# Patient Record
Sex: Female | Born: 1985 | Race: White | Hispanic: No | Marital: Married | Smoking: Never smoker
Health system: Southern US, Community
[De-identification: ages and names within clinical notes are randomized; demographics above are authoritative.]

## PROBLEM LIST (undated history)

## (undated) ENCOUNTER — Emergency Department (HOSPITAL_COMMUNITY): Payer: No Typology Code available for payment source

## (undated) DIAGNOSIS — Z8619 Personal history of other infectious and parasitic diseases: Secondary | ICD-10-CM

## (undated) DIAGNOSIS — D649 Anemia, unspecified: Secondary | ICD-10-CM

## (undated) HISTORY — DX: Personal history of other infectious and parasitic diseases: Z86.19

## (undated) HISTORY — PX: MANDIBLE SURGERY: SHX707

---

## 2014-08-17 ENCOUNTER — Ambulatory Visit (INDEPENDENT_AMBULATORY_CARE_PROVIDER_SITE_OTHER): Payer: PRIVATE HEALTH INSURANCE | Admitting: Sports Medicine

## 2014-08-17 ENCOUNTER — Ambulatory Visit (INDEPENDENT_AMBULATORY_CARE_PROVIDER_SITE_OTHER): Payer: PRIVATE HEALTH INSURANCE

## 2014-08-17 ENCOUNTER — Encounter: Payer: Self-pay | Admitting: Sports Medicine

## 2014-08-17 VITALS — BP 112/70 | HR 56 | Ht 64.0 in | Wt 113.0 lb

## 2014-08-17 DIAGNOSIS — M25551 Pain in right hip: Secondary | ICD-10-CM

## 2014-08-17 DIAGNOSIS — Z0000 Encounter for general adult medical examination without abnormal findings: Secondary | ICD-10-CM | POA: Insufficient documentation

## 2014-08-17 DIAGNOSIS — Z8262 Family history of osteoporosis: Secondary | ICD-10-CM | POA: Insufficient documentation

## 2014-08-17 DIAGNOSIS — B354 Tinea corporis: Secondary | ICD-10-CM

## 2014-08-17 DIAGNOSIS — M25559 Pain in unspecified hip: Secondary | ICD-10-CM

## 2014-08-17 MED ORDER — DICLOFENAC SODIUM 75 MG PO TBEC: 75.0000 mg | DELAYED_RELEASE_TABLET | Freq: Two times a day (BID) | ORAL | Status: DC

## 2014-08-17 MED ORDER — CLOTRIMAZOLE-BETAMETHASONE 1-0.05 % EX CREA: 1.0000 "application " | TOPICAL_CREAM | Freq: Two times a day (BID) | CUTANEOUS | Status: DC

## 2014-08-17 NOTE — Assessment & Plan Note (Signed)
Topical Lotrisone. 

## 2014-08-17 NOTE — Patient Instructions (Signed)

## 2014-08-17 NOTE — Assessment & Plan Note (Signed)
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 We will start conservatively with hip flexor rehabilitation and diclofenac. X-rays.

## 2014-08-17 NOTE — Assessment & Plan Note (Signed)
Up-to-date on cervical cancer screening earlier this year. Checking routine blood work.

## 2014-08-17 NOTE — Assessment & Plan Note (Signed)
Per patient request we are going to obtain a DEXA scan.

## 2014-08-17 NOTE — Progress Notes (Signed)
  Subjective:    CC: Establish care.   HPI:  This is a very pleasant 28 year old female comes to establish care.  Skin rash: Present on the right abdomen, circular, pruritic, present for several weeks. She does have a dog. No other lesions.  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 No mechanical symptoms, mild, persistent.  History of osteoporosis: Desires bone density testing.  Preventative measures: Up-to-date on cervical cancer screening, needs a flu shot, up to date on TD.  Past medical history, Surgical history, Family history not pertinant except as noted below, Social history, Allergies, and medications have been entered into the medical record, reviewed, and no changes needed.   Review of Systems: No headache, visual changes, nausea, vomiting, diarrhea, constipation, dizziness, abdominal pain, skin rash, fevers, chills, night sweats, swollen lymph nodes, weight loss, chest pain, body aches, joint swelling, muscle aches, shortness of breath, mood changes, visual or auditory hallucinations.  Objective:    General: Well Developed, well nourished, and in no acute distress.  Neuro: Alert and oriented x3, extra-ocular muscles intact, sensation grossly intact.  HEENT: Normocephalic, atraumatic, pupils equal round reactive to light, neck supple, no masses, no lymphadenopathy, thyroid nonpalpable.  Skin: Warm and dry, there is a circular, scaly, erythematous rash on the right lower abdomen approximately 3 cm across. Cardiac: Regular rate and rhythm, no murmurs rubs or gallops.  Respiratory: Clear to auscultation bilaterally. Not using accessory muscles, speaking in full sentences.  Abdominal: Soft, nontender, nondistended, positive bowel sounds, no masses, no organomegaly.  Bilateral Hip: ROM IR: 60 Deg, ER: 60 Deg, Flexion: 120 Deg, Extension: 100 Deg, Abduction: 45 Deg, Adduction: 45 Deg Strength IR: 5/5, ER: 5/5, Flexion:  5/5, Extension: 5/5, Abduction: 5/5, Adduction: 5/5 Pelvic alignment unremarkable to inspection and palpation. Standing hip rotation and gait without trendelenburg / unsteadiness. Greater trochanter without tenderness to palpation. No tenderness over piriformis. No SI joint tenderness and normal minimal SI movement. Positive FADIR test worse on the right.  Impression and Recommendations:    The patient was counselled, risk factors were discussed, anticipatory guidance given.

## 2014-09-14 ENCOUNTER — Ambulatory Visit: Payer: PRIVATE HEALTH INSURANCE | Admitting: Sports Medicine

## 2014-09-27 ENCOUNTER — Encounter: Payer: Self-pay | Admitting: Sports Medicine

## 2014-09-27 ENCOUNTER — Ambulatory Visit (INDEPENDENT_AMBULATORY_CARE_PROVIDER_SITE_OTHER): Payer: PRIVATE HEALTH INSURANCE | Admitting: Sports Medicine

## 2014-09-27 VITALS — BP 130/82 | HR 64 | Ht 59.0 in | Wt 111.0 lb

## 2014-09-27 DIAGNOSIS — B354 Tinea corporis: Secondary | ICD-10-CM

## 2014-09-27 DIAGNOSIS — Z23 Encounter for immunization: Secondary | ICD-10-CM

## 2014-09-27 DIAGNOSIS — Z0000 Encounter for general adult medical examination without abnormal findings: Secondary | ICD-10-CM

## 2014-09-27 MED ORDER — ITRACONAZOLE 200 MG PO TABS: 1.0000 | ORAL_TABLET | Freq: Every day | ORAL | Status: DC

## 2014-09-27 NOTE — Assessment & Plan Note (Signed)
Up-to-date. Tdap given today.

## 2014-09-27 NOTE — Progress Notes (Signed)
  Subjective:    CC: Follow-up   HPI: Skin rash: I diagnosed her with tinea corporis at the last visit, we started with Lotrisone, unfortunately the rash worsened. It is only mildly pruritic.  Preventative measures: Up-to-date on flu, needs TD, Pap smear was in April of this year.  Hip pain: Resolved  Past medical history, Surgical history, Family history not pertinant except as noted below, Social history, Allergies, and medications have been entered into the medical record, reviewed, and no changes needed.   Review of Systems: No fevers, chills, night sweats, weight loss, chest pain, or shortness of breath.   Objective:    General: Well Developed, well nourished, and in no acute distress.  Neuro: Alert and oriented x3, extra-ocular muscles intact, sensation grossly intact.  HEENT: Normocephalic, atraumatic, pupils equal round reactive to light, neck supple, no masses, no lymphadenopathy, thyroid nonpalpable.  Skin: Warm and dry, there is a 6 cm circular rash with scaling border on the left anterior abdominal wall. Cardiac: Regular rate and rhythm, no murmurs rubs or gallops, no lower extremity edema.  Respiratory: Clear to auscultation bilaterally. Not using accessory muscles, speaking in full sentences.  Impression and Recommendations:

## 2014-09-27 NOTE — Assessment & Plan Note (Signed)
Persistence despite topical Lotrisone. Switching to oral itraconazole 200 mg daily for a week. Return in 2 weeks, if rash is still present we will biopsy.

## 2014-10-12 ENCOUNTER — Ambulatory Visit: Payer: PRIVATE HEALTH INSURANCE | Admitting: Sports Medicine

## 2014-10-13 ENCOUNTER — Ambulatory Visit (INDEPENDENT_AMBULATORY_CARE_PROVIDER_SITE_OTHER): Payer: PRIVATE HEALTH INSURANCE | Admitting: Sports Medicine

## 2014-10-13 ENCOUNTER — Encounter: Payer: Self-pay | Admitting: Sports Medicine

## 2014-10-13 VITALS — BP 111/70 | HR 60 | Ht 64.0 in | Wt 112.0 lb

## 2014-10-13 DIAGNOSIS — B354 Tinea corporis: Secondary | ICD-10-CM

## 2014-10-13 NOTE — Progress Notes (Signed)
  Subjective:    CC: follow-up  HPI: Tinea corporis: Large right-sided anterior abdominal lesion.  Initially [place] failed topical Lotrisone cream, in fact her lesion worsen, subsequently I switched to itraconazole for 7 days, she returns today with rash resolved.  Past medical history, Surgical history, Family history not pertinant except as noted below, Social history, Allergies, and medications have been entered into the medical record, reviewed, and no changes needed.   Review of Systems: No fevers, chills, night sweats, weight loss, chest pain, or shortness of breath.   Objective:    General: Well Developed, well nourished, and in no acute distress.  Neuro: Alert and oriented x3, extra-ocular muscles intact, sensation grossly intact.  HEENT: Normocephalic, atraumatic, pupils equal round reactive to light, neck supple, no masses, no lymphadenopathy, thyroid nonpalpable.  Skin: Warm and dry, no rashes.there is a slight area of postinflammatory hypopigmentation from the rash however it is no longer scaling, smaller, and essentially resolved. No pruritus or excoriations. Cardiac: Regular rate and rhythm, no murmurs rubs or gallops, no lower extremity edema.  Respiratory: Clear to auscultation bilaterally. Not using accessory muscles, speaking in full sentences.  Impression and Recommendations:

## 2014-10-13 NOTE — Assessment & Plan Note (Signed)
Insufficient response to topical Lotrisone. Rash has essentially resolved with oral itraconazole. Return as needed.

## 2014-11-30 ENCOUNTER — Encounter: Payer: Self-pay | Admitting: Physician Assistant

## 2014-11-30 ENCOUNTER — Ambulatory Visit (INDEPENDENT_AMBULATORY_CARE_PROVIDER_SITE_OTHER): Payer: PRIVATE HEALTH INSURANCE | Admitting: Physician Assistant

## 2014-11-30 VITALS — BP 129/84 | HR 86 | Ht 64.0 in | Wt 111.0 lb

## 2014-11-30 DIAGNOSIS — Z0000 Encounter for general adult medical examination without abnormal findings: Secondary | ICD-10-CM

## 2014-11-30 DIAGNOSIS — R110 Nausea: Secondary | ICD-10-CM

## 2014-11-30 DIAGNOSIS — N898 Other specified noninflammatory disorders of vagina: Secondary | ICD-10-CM

## 2014-11-30 MED ORDER — OMEPRAZOLE 40 MG PO CPDR: 40.0000 mg | DELAYED_RELEASE_CAPSULE | Freq: Every day | ORAL | Status: DC

## 2014-11-30 NOTE — Progress Notes (Signed)
Subjective:    Patient ID: Dana Eaton, female    DOB: 03/15/1986, 28 y.o.   MRN: [id number]  HPI Yeast on pap- treated over the counter. Feel like kept it since April. Often thick discharge.  Stomach pain towards end of work. Nausea off and on. 1 month. Sexual active no protection.    Review of Systems     Objective:   Physical Exam        Assessment & Plan:   Subjective:     Dana Eaton is a 28 y.o. female and is here for a comprehensive physical exam. The patient reports problems - she continues to have thick white vaginal discharge since april. does not itch. no odor. no pain. denies any pelivic pain. last pap normal and  april 2015. she is also having on and off nausea iwthout vomiting. nothing makes better or worse. seems to be while she is at work towards the end of the day. no fever, or chills. .  History   Social History  . Marital Status: Married    Spouse Name: N/A    Number of Children: N/A  . Years of Education: N/A   Occupational History  . Not on file.   Social History Main Topics  . Smoking status: Never Smoker   . Smokeless tobacco: Not on file  . Alcohol Use: Not on file  . Drug Use: Not on file  . Sexual Activity: Not on file   Other Topics Concern  . Not on file   Social History Narrative   Health Maintenance  Topic Date Due  . INFLUENZA VACCINE  07/04/2015  . PAP SMEAR  03/03/2017  . TETANUS/TDAP  09/27/2024    The following portions of the patient's history were reviewed and updated as appropriate: allergies, current medications, past family history, past medical history, past social history, past surgical history and problem list.  Review of Systems A comprehensive review of systems was negative.   Objective:    BP 129/84 mmHg  Pulse 86  Ht 5\' 4"  (1.626 m)  Wt 111 lb (50.349 kg)  BMI 19.04 kg/m2 General appearance: alert, cooperative and appears stated age Head: Normocephalic, without obvious abnormality,  atraumatic Eyes: conjunctivae/corneas clear. PERRL, EOM's intact. Fundi benign. Ears: normal TM's and external ear canals both ears Nose: Nares normal. Septum midline. Mucosa normal. No drainage or sinus tenderness. Throat: lips, mucosa, and tongue normal; teeth and gums normal Neck: no adenopathy, no carotid bruit, no JVD, supple, symmetrical, trachea midline and thyroid not enlarged, symmetric, no tenderness/mass/nodules Back: symmetric, no curvature. ROM normal. No CVA tenderness. Lungs: clear to auscultation bilaterally Heart: regular rate and rhythm, S1, S2 normal, no murmur, click, rub or gallop Abdomen: soft, non-tender; bowel sounds normal; no masses,  no organomegaly Pelvic: cervix normal in appearance, external genitalia normal, no adnexal masses or tenderness, no cervical motion tenderness and vaginal area with white thick discharge around cervix.  Extremities: extremities normal, atraumatic, no cyanosis or edema Pulses: 2+ and symmetric Skin: Skin color, texture, turgor normal. No rashes or lesions Lymph nodes: Cervical, supraclavicular, and axillary nodes normal. Neurologic: Grossly normal    Assessment:    Healthy female exam.      Plan:    CPE- pap up to date and did not repeat today. Vaccines up to date. Due to vaginal discharge obtained a wet prep. Discussed exercise 150minutes weekly and MVI.   Vaginal discharge- wet prep sent off. Pt concerned with yeast overgrowth in her whole body. Gave  stool culture cup to look for yeast in intestine. Reassured would treat if wet prep positive. Not as likely yeast overgrowth in intestines but will evaluate. Follow up with PCP.   Nausea without vomiting- UPT negative. Could represent signs of acid reflux. Given omeprazole to try. If worsens or continues follow up with PCP.  See After Visit Summary for Counseling Recommendations

## 2014-11-30 NOTE — Patient Instructions (Signed)

## 2014-12-01 ENCOUNTER — Other Ambulatory Visit: Payer: Self-pay | Admitting: Sports Medicine

## 2014-12-01 LAB — COMPREHENSIVE METABOLIC PANEL
Alkaline Phosphatase: 34 U/L — ABNORMAL LOW (ref 39–117)
BUN: 16 mg/dL (ref 6–23)
CO2: 25 mEq/L (ref 19–32)
Chloride: 102 mEq/L (ref 96–112)
Creat: 0.77 mg/dL (ref 0.50–1.10)
Total Bilirubin: 0.8 mg/dL (ref 0.2–1.2)
Total Protein: 6.9 g/dL (ref 6.0–8.3)

## 2014-12-01 LAB — CBC
HCT: 39.1 % (ref 36.0–46.0)
Hemoglobin: 13.3 g/dL (ref 12.0–15.0)
MCH: 31.6 pg (ref 26.0–34.0)
MCHC: 34 g/dL (ref 30.0–36.0)
MCV: 92.9 fL (ref 78.0–100.0)
Platelets: 233 10*3/uL (ref 150–400)
RBC: 4.21 MIL/uL (ref 3.87–5.11)
RDW: 12.7 % (ref 11.5–15.5)
WBC: 7.2 K/uL (ref 4.0–10.5)

## 2014-12-01 LAB — WET PREP FOR TRICH, YEAST, CLUE
Clue Cells Wet Prep HPF POC: NONE SEEN
Trich, Wet Prep: NONE SEEN
WBC, Wet Prep HPF POC: NONE SEEN
YEAST WET PREP: NONE SEEN

## 2014-12-01 LAB — HEMOGLOBIN A1C
Hgb A1c MFr Bld: 5 % (ref <5.7)
Mean Plasma Glucose: 97 mg/dL (ref <117)

## 2014-12-01 LAB — COMPREHENSIVE METABOLIC PANEL WITH GFR
ALT: 14 U/L (ref 0–35)
AST: 16 U/L (ref 0–37)
Albumin: 4.5 g/dL (ref 3.5–5.2)
Calcium: 9.3 mg/dL (ref 8.4–10.5)
Glucose, Bld: 91 mg/dL (ref 70–99)
Potassium: 3.9 meq/L (ref 3.5–5.3)
Sodium: 137 meq/L (ref 135–145)

## 2014-12-01 LAB — LIPID PANEL
Cholesterol: 139 mg/dL (ref 0–200)
HDL: 61 mg/dL (ref >39)
LDL Cholesterol: 69 mg/dL (ref 0–99)
Total CHOL/HDL Ratio: 2.3 ratio
Triglycerides: 46 mg/dL (ref <150)
VLDL: 9 mg/dL (ref 0–40)

## 2014-12-01 LAB — POCT URINE PREGNANCY: Preg Test, Ur: NEGATIVE

## 2014-12-02 LAB — VITAMIN D 25 HYDROXY (VIT D DEFICIENCY, FRACTURES): Vit D, 25-Hydroxy: 42 ng/mL (ref 30–100)

## 2014-12-02 LAB — TSH: TSH: 1.641 u[IU]/mL (ref 0.350–4.500)

## 2014-12-05 LAB — STOOL CULTURE

## 2014-12-06 LAB — HSV(HERPES SMPLX)ABS-I+II(IGG+IGM)-BLD
HSV 1 Glycoprotein G Ab, IgG: 11.2 IV — ABNORMAL HIGH
HSV 2 Glycoprotein G Ab, IgG: <0.1 IV
Herpes Simplex Vrs I&II-IgM Ab (EIA): 2.24 INDEX — ABNORMAL HIGH

## 2014-12-13 ENCOUNTER — Other Ambulatory Visit: Payer: Self-pay | Admitting: Sports Medicine

## 2014-12-13 DIAGNOSIS — N898 Other specified noninflammatory disorders of vagina: Secondary | ICD-10-CM

## 2014-12-14 LAB — GC/CHLAMYDIA PROBE AMP, URINE
CHLAMYDIA, SWAB/URINE, PCR: NEGATIVE
GC PROBE AMP, URINE: NEGATIVE

## 2015-12-04 NOTE — L&D Delivery Note (Signed)
Delivery Note Pt progressed to complete at 0049 and began pushing well. At 1:46 AM, with pt in hands and knees position, a viable female was delivered via Vaginal, Spontaneous Delivery (Presentation: ROA w/ compound hand ).  APGAR: 9, 9; weight: pending  .   Placenta status: spont , intact .  Cord:  3 vessels  Anesthesia:  None Episiotomy: None Lacerations: None (sm abrasion L labia- not repaired) Est. Blood Loss (mL): 50  Mom to postpartum.  Baby to Couplet care / Skin to Skin.  Cam HaiSHAW, Amory Zbikowski CNM 10/04/2016, 2:05 AM

## 2016-02-16 ENCOUNTER — Encounter: Payer: PRIVATE HEALTH INSURANCE | Admitting: Obstetrics & Gynecology

## 2016-02-20 ENCOUNTER — Encounter: Payer: Self-pay | Admitting: Advanced Practice Midwife

## 2016-02-20 ENCOUNTER — Ambulatory Visit (INDEPENDENT_AMBULATORY_CARE_PROVIDER_SITE_OTHER): Payer: No Typology Code available for payment source | Admitting: Advanced Practice Midwife

## 2016-02-20 ENCOUNTER — Other Ambulatory Visit: Payer: Self-pay | Admitting: Advanced Practice Midwife

## 2016-02-20 VITALS — BP 108/66 | HR 63 | Wt 111.0 lb

## 2016-02-20 DIAGNOSIS — Z3401 Encounter for supervision of normal first pregnancy, first trimester: Secondary | ICD-10-CM

## 2016-02-20 DIAGNOSIS — Z124 Encounter for screening for malignant neoplasm of cervix: Secondary | ICD-10-CM | POA: Diagnosis not present

## 2016-02-20 DIAGNOSIS — Z3491 Encounter for supervision of normal pregnancy, unspecified, first trimester: Secondary | ICD-10-CM | POA: Diagnosis not present

## 2016-02-20 DIAGNOSIS — Z113 Encounter for screening for infections with a predominantly sexual mode of transmission: Secondary | ICD-10-CM | POA: Diagnosis not present

## 2016-02-20 DIAGNOSIS — Z36 Encounter for antenatal screening of mother: Secondary | ICD-10-CM

## 2016-02-20 LAB — POCT BEDSIDE ULTRASOUND
CRL: 1.6 cm
Heart Rate Cardiac Output M-Mode: 158 {beats}/min

## 2016-02-20 NOTE — Patient Instructions (Signed)
First Trimester of Pregnancy The first trimester of pregnancy is from week 1 until the end of week 12 (months 1 through 3). A week after a sperm fertilizes an egg, the egg will implant on the wall of the uterus. This embryo will begin to develop into a baby. Genes from you and your partner are forming the baby. The female genes determine whether the baby is a boy or a girl. At 6-8 weeks, the eyes and face are formed, and the heartbeat can be seen on ultrasound. At the end of 12 weeks, all the baby's organs are formed.  Now that you are pregnant, you will want to do everything you can to have a healthy baby. Two of the most important things are to get good prenatal care and to follow your health care provider's instructions. Prenatal care is all the medical care you receive before the baby's birth. This care will help prevent, find, and treat any problems during the pregnancy and childbirth. BODY CHANGES Your body goes through many changes during pregnancy. The changes vary from woman to woman.   You may gain or lose a couple of pounds at first.  You may feel sick to your stomach (nauseous) and throw up (vomit). If the vomiting is uncontrollable, call your health care provider.  You may tire easily.  You may develop headaches that can be relieved by medicines approved by your health care provider.  You may urinate more often. Painful urination may mean you have a bladder infection.  You may develop heartburn as a result of your pregnancy.  You may develop constipation because certain hormones are causing the muscles that push waste through your intestines to slow down.  You may develop hemorrhoids or swollen, bulging veins (varicose veins).  Your breasts may begin to grow larger and become tender. Your nipples may stick out more, and the tissue that surrounds them (areola) may become darker.  Your gums may bleed and may be sensitive to brushing and flossing.  Dark spots or blotches (chloasma,  mask of pregnancy) may develop on your face. This will likely fade after the baby is born.  Your menstrual periods will stop.  You may have a loss of appetite.  You may develop cravings for certain kinds of food.  You may have changes in your emotions from day to day, such as being excited to be pregnant or being concerned that something may go wrong with the pregnancy and baby.  You may have more vivid and strange dreams.  You may have changes in your hair. These can include thickening of your hair, rapid growth, and changes in texture. Some women also have hair loss during or after pregnancy, or hair that feels dry or thin. Your hair will most likely return to normal after your baby is born. WHAT TO EXPECT AT YOUR PRENATAL VISITS During a routine prenatal visit:  You will be weighed to make sure you and the baby are growing normally.  Your blood pressure will be taken.  Your abdomen will be measured to track your baby's growth.  The fetal heartbeat will be listened to starting around week 10 or 12 of your pregnancy.  Test results from any previous visits will be discussed. Your health care provider may ask you:  How you are feeling.  If you are feeling the baby move.  If you have had any abnormal symptoms, such as leaking fluid, bleeding, severe headaches, or abdominal cramping.  If you are using any tobacco products,   including cigarettes, chewing tobacco, and electronic cigarettes.  If you have any questions. Other tests that may be performed during your first trimester include:  Blood tests to find your blood type and to check for the presence of any previous infections. They will also be used to check for low iron levels (anemia) and Rh antibodies. Later in the pregnancy, blood tests for diabetes will be done along with other tests if problems develop.  Urine tests to check for infections, diabetes, or protein in the urine.  An ultrasound to confirm the proper growth  and development of the baby.  An amniocentesis to check for possible genetic problems.  Fetal screens for spina bifida and Down syndrome.  You may need other tests to make sure you and the baby are doing well.  HIV (human immunodeficiency virus) testing. Routine prenatal testing includes screening for HIV, unless you choose not to have this test. HOME CARE INSTRUCTIONS  Medicines  Follow your health care provider's instructions regarding medicine use. Specific medicines may be either safe or unsafe to take during pregnancy.  Take your prenatal vitamins as directed.  If you develop constipation, try taking a stool softener if your health care provider approves. Diet  Eat regular, well-balanced meals. Choose a variety of foods, such as meat or vegetable-based protein, fish, milk and low-fat dairy products, vegetables, fruits, and whole grain breads and cereals. Your health care provider will help you determine the amount of weight gain that is right for you.  Avoid raw meat and uncooked cheese. These carry germs that can cause birth defects in the baby.  Eating four or five small meals rather than three large meals a day may help relieve nausea and vomiting. If you start to feel nauseous, eating a few soda crackers can be helpful. Drinking liquids between meals instead of during meals also seems to help nausea and vomiting.  If you develop constipation, eat more high-fiber foods, such as fresh vegetables or fruit and whole grains. Drink enough fluids to keep your urine clear or pale yellow. Activity and Exercise  Exercise only as directed by your health care provider. Exercising will help you:  Control your weight.  Stay in shape.  Be prepared for labor and delivery.  Experiencing pain or cramping in the lower abdomen or low back is a good sign that you should stop exercising. Check with your health care provider before continuing normal exercises.  Try to avoid standing for long  periods of time. Move your legs often if you must stand in one place for a long time.  Avoid heavy lifting.  Wear low-heeled shoes, and practice good posture.  You may continue to have sex unless your health care provider directs you otherwise. Relief of Pain or Discomfort  Wear a good support bra for breast tenderness.   Take warm sitz baths to soothe any pain or discomfort caused by hemorrhoids. Use hemorrhoid cream if your health care provider approves.   Rest with your legs elevated if you have leg cramps or low back pain.  If you develop varicose veins in your legs, wear support hose. Elevate your feet for 15 minutes, 3-4 times a day. Limit salt in your diet. Prenatal Care  Schedule your prenatal visits by the twelfth week of pregnancy. They are usually scheduled monthly at first, then more often in the last 2 months before delivery.  Write down your questions. Take them to your prenatal visits.  Keep all your prenatal visits as directed by your   health care provider. Safety  Wear your seat belt at all times when driving.  Make a list of emergency phone numbers, including numbers for family, friends, the hospital, and police and fire departments. General Tips  Ask your health care provider for a referral to a local prenatal education class. Begin classes no later than at the beginning of month 6 of your pregnancy.  Ask for help if you have counseling or nutritional needs during pregnancy. Your health care provider can offer advice or refer you to specialists for help with various needs.  Do not use hot tubs, steam rooms, or saunas.  Do not douche or use tampons or scented sanitary pads.  Do not cross your legs for long periods of time.  Avoid cat litter boxes and soil used by cats. These carry germs that can cause birth defects in the baby and possibly loss of the fetus by miscarriage or stillbirth.  Avoid all smoking, herbs, alcohol, and medicines not prescribed by  your health care provider. Chemicals in these affect the formation and growth of the baby.  Do not use any tobacco products, including cigarettes, chewing tobacco, and electronic cigarettes. If you need help quitting, ask your health care provider. You may receive counseling support and other resources to help you quit.  Schedule a dentist appointment. At home, brush your teeth with a soft toothbrush and be gentle when you floss. SEEK MEDICAL CARE IF:   You have dizziness.  You have mild pelvic cramps, pelvic pressure, or nagging pain in the abdominal area.  You have persistent nausea, vomiting, or diarrhea.  You have a bad smelling vaginal discharge.  You have pain with urination.  You notice increased swelling in your face, hands, legs, or ankles. SEEK IMMEDIATE MEDICAL CARE IF:   You have a fever.  You are leaking fluid from your vagina.  You have spotting or bleeding from your vagina.  You have severe abdominal cramping or pain.  You have rapid weight gain or loss.  You vomit blood or material that looks like coffee grounds.  You are exposed to German measles and have never had them.  You are exposed to fifth disease or chickenpox.  You develop a severe headache.  You have shortness of breath.  You have any kind of trauma, such as from a fall or a car accident.   This information is not intended to replace advice given to you by your health care provider. Make sure you discuss any questions you have with your health care provider.   Document Released: 11/13/2001 Document Revised: 12/10/2014 Document Reviewed: 09/29/2013 Elsevier Interactive Patient Education 2016 [employer]  

## 2016-02-20 NOTE — Progress Notes (Signed)
   Subjective:    Dana Eaton is a G1P0 3222w6d by LMP and US today being seen today for her first obstetrical visit.  Her obstetrical history is significant for nulliparity. Patient does intend to breast feed. Pregnancy history fully reviewed.  Patient reports no complaints. Many questions about cosmetic, food and medication exposures.   Filed Vitals:   02/20/16 0949  BP: 108/66  Pulse: 63  Weight: 111 lb (50.349 kg)    HISTORY: OB History  Gravida Para Term Preterm AB SAB TAB Ectopic Multiple Living  1             # Outcome Date GA Lbr Len/2nd Weight Sex Delivery Anes PTL Lv  1 Current              History reviewed. No pertinent past medical history. History reviewed. No pertinent past surgical history. Family History  Problem Relation Age of Onset  . Osteoarthritis Maternal Grandmother   . Osteoporosis Maternal Grandmother   . Seizures Father   . Pancreatic cancer Maternal Grandmother [id number]     Exam    Uterus:     Pelvic Exam:    Perineum: No Hemorrhoids, Normal Perineum   Vulva: normal, Bartholin's, Urethra, Skene's normal   Vagina:  normal mucosa, normal discharge   pH: NA   Cervix: no bleeding following Pap, no cervical motion tenderness, nulliparous appearance and ectropion   Adnexa: normal adnexa, no mass, fullness, tenderness and normal ovaries palpated bilaterally   Bony Pelvis: average  System: Breast:  normal appearance, no masses or tenderness, No nipple retraction or dimpling, No nipple discharge or bleeding, No axillary or supraclavicular adenopathy, Normal to palpation without dominant masses   Skin: normal coloration and turgor, no rashes    Neurologic: oriented, normal mood, grossly non-focal   Extremities: normal strength, tone, and muscle mass   HEENT sclera clear, anicteric and oropharynx clear, no lesions   Mouth/Teeth mucous membranes moist, pharynx normal without lesions and dental hygiene good   Neck supple and no masses   Cardiovascular:  regular rate and rhythm, no murmurs or gallops   Respiratory:  appears well, vitals normal, no respiratory distress, acyanotic, normal RR, chest clear, no wheezing, crepitations, rhonchi, normal symmetric air entry   Abdomen: soft, non-tender; bowel sounds normal; no masses,  no organomegaly   Urinary: urethral meatus normal      Assessment:    Pregnancy: G1P0 Patient Active Problem List   Diagnosis Date Noted  . Annual physical exam 08/17/2014  . Tinea corporis 08/17/2014  . Family history of osteoporosis 08/17/2014   1. Normal pregnancy, first trimester  - Prenatal Profile - POCT bedside ultrasound - Culture, OB Urine - GC/Chlamydia Probe Amp - US MFM Fetal Nuchal Translucency; Future - Cystic fibrosis diagnostic study - Cytology - PAP    Plan:     Initial labs drawn. Prenatal vitamins. Problem list reviewed and updated. Discussed avoiding unnecessary exposures expecially in first trimester  Genetic Screening discussed First Screen: ordered.  Ultrasound discussed; fetal survey: requested.  Follow up in 4 weeks.  Dorathy KinsmanSMITH, Edra Riccardi 02/20/2016

## 2016-02-20 NOTE — Progress Notes (Signed)
Bedside US shows single IUP with FHR 158 and CRL 2662w6d consistent with LMP - Resulted under "Labs" in chart review

## 2016-02-21 DIAGNOSIS — Z3400 Encounter for supervision of normal first pregnancy, unspecified trimester: Secondary | ICD-10-CM | POA: Insufficient documentation

## 2016-02-21 LAB — GC/CHLAMYDIA PROBE AMP
CT Probe RNA: NOT DETECTED
GC PROBE AMP APTIMA: NOT DETECTED

## 2016-02-21 LAB — PRENATAL PROFILE (SOLSTAS)
ANTIBODY SCREEN: NEGATIVE
Basophils Absolute: 0 10*3/uL (ref 0.0–0.1)
Basophils Relative: 0 % (ref 0–1)
EOS PCT: 2 % (ref 0–5)
Eosinophils Absolute: 0.2 10*3/uL (ref 0.0–0.7)
HCT: 38.2 % (ref 36.0–46.0)
HIV 1&2 Ab, 4th Generation: NONREACTIVE
Hemoglobin: 13.1 g/dL (ref 12.0–15.0)
Hepatitis B Surface Ag: NEGATIVE
LYMPHS ABS: 2.3 10*3/uL (ref 0.7–4.0)
Lymphocytes Relative: 21 % (ref 12–46)
MCH: 32.5 pg (ref 26.0–34.0)
MCHC: 34.3 g/dL (ref 30.0–36.0)
MCV: 94.8 fL (ref 78.0–100.0)
MONOS PCT: 6 % (ref 3–12)
MPV: 11.7 fL (ref 8.6–12.4)
Monocytes Absolute: 0.7 10*3/uL (ref 0.1–1.0)
NEUTROS PCT: 71 % (ref 43–77)
Neutro Abs: 7.7 10*3/uL (ref 1.7–7.7)
PLATELETS: 245 10*3/uL (ref 150–400)
RBC: 4.03 MIL/uL (ref 3.87–5.11)
RDW: 12.6 % (ref 11.5–15.5)
Rh Type: POSITIVE
Rubella: 3.16 Index — ABNORMAL HIGH (ref <0.90)
WBC: 10.9 10*3/uL — ABNORMAL HIGH (ref 4.0–10.5)

## 2016-02-22 LAB — CULTURE, OB URINE
Colony Count: NO GROWTH
Organism ID, Bacteria: NO GROWTH

## 2016-02-22 LAB — CYSTIC FIBROSIS DIAGNOSTIC STUDY

## 2016-02-22 LAB — CYTOLOGY - PAP

## 2016-03-09 ENCOUNTER — Encounter (HOSPITAL_COMMUNITY): Payer: Self-pay | Admitting: Advanced Practice Midwife

## 2016-03-19 ENCOUNTER — Encounter: Payer: Self-pay | Admitting: Advanced Practice Midwife

## 2016-03-19 ENCOUNTER — Ambulatory Visit (INDEPENDENT_AMBULATORY_CARE_PROVIDER_SITE_OTHER): Payer: No Typology Code available for payment source | Admitting: Advanced Practice Midwife

## 2016-03-19 VITALS — BP 106/65 | HR 62 | Wt 115.0 lb

## 2016-03-19 DIAGNOSIS — Z3401 Encounter for supervision of normal first pregnancy, first trimester: Secondary | ICD-10-CM

## 2016-03-19 NOTE — Patient Instructions (Signed)

## 2016-03-19 NOTE — Progress Notes (Signed)
Still undecided about Baby Scripts.

## 2016-03-19 NOTE — Progress Notes (Signed)
Subjective:  Dana Eaton is a 30 y.o. G1P0 at 1540w6d being seen today for ongoing prenatal care.  She is currently monitored for the following issues for this low-risk pregnancy and has Family history of osteoporosis and Supervision of normal first pregnancy, antepartum on her problem list.  Patient reports no complaints.  Contractions: Not present. Vag. Bleeding: None.  Movement: Absent. Denies leaking of fluid.   The following portions of the patient's history were reviewed and updated as appropriate: allergies, current medications, past family history, past medical history, past social history, past surgical history and problem list. Problem list updated.  Objective:   Filed Vitals:   03/19/16 0821  BP: 106/65  Pulse: 62  Weight: 52.164 kg (115 lb)    Fetal Status:   Fundal Height: 12 cm Movement: Absent     General:  Alert, oriented and cooperative. Patient is in no acute distress.  Skin: Skin is warm and dry. No rash noted.   Cardiovascular: Normal heart rate noted  Respiratory: Normal respiratory effort, no problems with respiration noted  Abdomen: Soft, gravid, appropriate for gestational age. Pain/Pressure: Present     Pelvic: Vag. Bleeding: None     Cervical exam deferred        Extremities: Normal range of motion.  Edema: None  Mental Status: Normal mood and affect. Normal behavior. Normal judgment and thought content.   Urinalysis: Urine Protein: Negative Urine Glucose: Negative  Assessment and Plan:  Pregnancy: G1P0 at 6740w6d  1. Supervision of normal first pregnancy, antepartum, first trimester      Discussed second trimester       Nausea better       Scheduled for first trimester screen      Reviewed prenatal labs were normal  Preterm labor symptoms and general obstetric precautions including but not limited to vaginal bleeding, contractions, leaking of fluid and fetal movement were reviewed in detail with the patient. Please refer to After Visit Summary for  other counseling recommendations.  Return in about 4 weeks (around 04/16/2016) [place] [facility].   Dana Eaton, CNM

## 2016-03-23 ENCOUNTER — Ambulatory Visit (HOSPITAL_COMMUNITY)
Admission: RE | Admit: 2016-03-23 | Discharge: 2016-03-23 | Disposition: A | Discharge status: Home / Self Care | Payer: No Typology Code available for payment source | Source: Ambulatory Visit | Attending: Advanced Practice Midwife | Admitting: Advanced Practice Midwife

## 2016-03-23 ENCOUNTER — Encounter (HOSPITAL_COMMUNITY): Payer: Self-pay

## 2016-03-23 DIAGNOSIS — Z3491 Encounter for supervision of normal pregnancy, unspecified, first trimester: Secondary | ICD-10-CM

## 2016-03-23 DIAGNOSIS — Z36 Encounter for antenatal screening of mother: Secondary | ICD-10-CM | POA: Diagnosis not present

## 2016-03-23 DIAGNOSIS — Z3A12 12 weeks gestation of pregnancy: Secondary | ICD-10-CM | POA: Diagnosis not present

## 2016-04-02 ENCOUNTER — Other Ambulatory Visit (HOSPITAL_COMMUNITY): Payer: Self-pay

## 2016-04-02 ENCOUNTER — Other Ambulatory Visit: Payer: Self-pay | Admitting: *Deleted

## 2016-04-02 DIAGNOSIS — Z3401 Encounter for supervision of normal first pregnancy, first trimester: Secondary | ICD-10-CM

## 2016-04-03 ENCOUNTER — Telehealth: Payer: Self-pay

## 2016-04-03 NOTE — Telephone Encounter (Signed)
Pt called requesting test results. 

## 2016-04-09 ENCOUNTER — Telehealth: Payer: Self-pay | Admitting: *Deleted

## 2016-04-09 NOTE — Telephone Encounter (Signed)
LM on voicemail that her 1 trimester screen was normal.  Pt apparently had called [facility] and message was routed to me.

## 2016-04-20 ENCOUNTER — Ambulatory Visit (INDEPENDENT_AMBULATORY_CARE_PROVIDER_SITE_OTHER): Payer: No Typology Code available for payment source | Admitting: Family

## 2016-04-20 VITALS — BP 91/55 | HR 69 | Temp 98.0°F | Wt 117.0 lb

## 2016-04-20 DIAGNOSIS — Z36 Encounter for antenatal screening of mother: Secondary | ICD-10-CM | POA: Diagnosis not present

## 2016-04-20 DIAGNOSIS — Z1389 Encounter for screening for other disorder: Secondary | ICD-10-CM

## 2016-04-20 DIAGNOSIS — Z3402 Encounter for supervision of normal first pregnancy, second trimester: Secondary | ICD-10-CM

## 2016-04-20 NOTE — Progress Notes (Signed)
Subjective:  Dana Eaton is a 30 y.o. G1P0 at 4125w3d being seen today for ongoing prenatal care.  She is currently monitored for the following issues for this low-risk pregnancy and has Family history of osteoporosis and Supervision of normal first pregnancy, antepartum on her problem list.  Patient reports no complaints.  Contractions: Not present. Vag. Bleeding: None.  Movement: Absent. Denies leaking of fluid.   The following portions of the patient's history were reviewed and updated as appropriate: allergies, current medications, past family history, past medical history, past social history, past surgical history and problem list. Problem list updated.  Objective:   Filed Vitals:   04/20/16 0840  BP: 91/55  Pulse: 69  Temp: 98 F (36.7 C)  Weight: 117 lb (53.071 kg)    Fetal Status: Fetal Heart Rate (bpm): 143 Fundal Height: 17 cm Movement: Absent     General:  Alert, oriented and cooperative. Patient is in no acute distress.  Skin: Skin is warm and dry. No rash noted.   Cardiovascular: Normal heart rate noted  Respiratory: Normal respiratory effort, no problems with respiration noted  Abdomen: Soft, gravid, appropriate for gestational age. Pain/Pressure: Present     Pelvic: Vag. Bleeding: None Vag D/C Character: Thin   Cervical exam deferred        Extremities: Normal range of motion.  Edema: None  Mental Status: Normal mood and affect. Normal behavior. Normal judgment and thought content.   Urinalysis: Urine Protein: Negative Urine Glucose: Negative  Assessment and Plan:  Pregnancy: G1P0 at 6525w3d  1. Supervision of normal first pregnancy, antepartum, second trimester - Alpha fetoprotein, maternal - Explained fetal growth measuring and expected fetal movement  2. Encounter for routine screening for malformation using ultrasonics - US MFM OB COMP + 14 WK; Future  Preterm labor symptoms and general obstetric precautions including but not limited to vaginal bleeding,  contractions, leaking of fluid and fetal movement were reviewed in detail with the patient. Please refer to After Visit Summary for other counseling recommendations.  Return in about 4 weeks (around 05/18/2016).   Eino FarberWalidah Kennith GainN Karim, CNM

## 2016-04-25 LAB — ALPHA FETOPROTEIN, MATERNAL
AFP: 50.6 ng/mL
CURR GEST AGE: 16.4 wk
MoM for AFP: 1.26
Open Spina bifida: NEGATIVE
Osb Risk: 1:5750 {titer}

## 2016-05-07 ENCOUNTER — Other Ambulatory Visit: Payer: Self-pay | Admitting: Family

## 2016-05-07 ENCOUNTER — Ambulatory Visit (HOSPITAL_COMMUNITY)
Admission: RE | Admit: 2016-05-07 | Discharge: 2016-05-07 | Disposition: A | Discharge status: Home / Self Care | Payer: No Typology Code available for payment source | Source: Ambulatory Visit | Attending: Family | Admitting: Family

## 2016-05-07 DIAGNOSIS — Z36 Encounter for antenatal screening of mother: Secondary | ICD-10-CM | POA: Diagnosis present

## 2016-05-07 DIAGNOSIS — Z3A18 18 weeks gestation of pregnancy: Secondary | ICD-10-CM

## 2016-05-07 DIAGNOSIS — Z1389 Encounter for screening for other disorder: Secondary | ICD-10-CM

## 2016-05-07 DIAGNOSIS — Z3402 Encounter for supervision of normal first pregnancy, second trimester: Secondary | ICD-10-CM

## 2016-05-07 DIAGNOSIS — Z3689 Encounter for other specified antenatal screening: Secondary | ICD-10-CM

## 2016-05-18 ENCOUNTER — Ambulatory Visit (INDEPENDENT_AMBULATORY_CARE_PROVIDER_SITE_OTHER): Payer: No Typology Code available for payment source | Admitting: Family

## 2016-05-18 VITALS — BP 101/60 | HR 81 | Wt 125.0 lb

## 2016-05-18 DIAGNOSIS — R42 Dizziness and giddiness: Secondary | ICD-10-CM | POA: Diagnosis not present

## 2016-05-18 DIAGNOSIS — Z3402 Encounter for supervision of normal first pregnancy, second trimester: Secondary | ICD-10-CM

## 2016-05-18 LAB — CBC
HCT: 33 % — ABNORMAL LOW (ref 35.0–45.0)
Hemoglobin: 10.8 g/dL — ABNORMAL LOW (ref 11.7–15.5)
MCH: 32.1 pg (ref 27.0–33.0)
MCHC: 32.7 g/dL (ref 32.0–36.0)
MCV: 98.2 fL (ref 80.0–100.0)
MPV: 10.6 fL (ref 7.5–12.5)
PLATELETS: 210 10*3/uL (ref 140–400)
RBC: 3.36 MIL/uL — AB (ref 3.80–5.10)
RDW: 13 % (ref 11.0–15.0)
WBC: 10.7 10*3/uL (ref 3.8–10.8)

## 2016-05-18 NOTE — Progress Notes (Signed)
Subjective:  Dana Eaton is a 30 y.o. G1P0 at [redacted]w[redacted]d being seen today for ongoing prenatal care.  She is currently monitored for the following issues for this low-risk pregnancy and has Family history of osteoporosis and Supervision of normal first pregnancy, antepartum on her problem list.  Patient reports having one episode of burning around umbilicus for 20 minutes.  Also reports occasional lightheadedness upon waking.  Feels better after eating.  Contractions: Not present. Vag. Bleeding: None.  Movement: Present. Denies leaking of fluid.   The following portions of the patient's history were reviewed and updated as appropriate: allergies, current medications, past family history, past medical history, past social history, past surgical history and problem list. Problem list updated.  Objective:   Filed Vitals:   05/18/16 0813  BP: 101/60  Eaton: 81  Weight: 125 lb (56.7 kg)    Fetal Status: Fetal Heart Rate (bpm): 147   Movement: Present     General:  Alert, oriented and cooperative. Patient is in no acute distress.  Skin: Skin is warm and dry. No rash noted.   Cardiovascular: Normal heart rate noted  Respiratory: Normal respiratory effort, no problems with respiration noted  Abdomen: Soft, gravid, appropriate for gestational age. Pain/Pressure: Absent 1 cm diastasis palpated; no hernia palpated  Pelvic: Cervical exam deferred        Extremities: Normal range of motion.  Edema: None  Mental Status: Normal mood and affect. Normal behavior. Normal judgment and thought content.   Urinalysis: Urine Protein: Negative Urine Glucose: Negative  Assessment and Plan:  Pregnancy: G1P0 at 284w3d  1. Lightheadedness - CBC - Recommended small frequent meals  2. Supervision of normal first pregnancy, antepartum, second trimester - Reviewed ultrasound (nml) and AFP (neg) results   Preterm labor symptoms and general obstetric precautions including but not limited to vaginal bleeding,  contractions, leaking of fluid and fetal movement were reviewed in detail with the patient. Please refer to After Visit Summary for other counseling recommendations.  Return in about 4 weeks (around 06/15/2016).   Eino FarberWalidah Kennith GainN Karim, CNM

## 2016-05-19 ENCOUNTER — Other Ambulatory Visit: Payer: Self-pay | Admitting: Family

## 2016-05-19 DIAGNOSIS — O99012 Anemia complicating pregnancy, second trimester: Secondary | ICD-10-CM

## 2016-05-19 MED ORDER — FERROUS SULFATE 325 (65 FE) MG PO TABS: 325.0000 mg | ORAL_TABLET | Freq: Two times a day (BID) | ORAL | Status: DC

## 2016-05-19 NOTE — Progress Notes (Signed)
RX for ferrous sulfate sent to pharmacy for anemia.  Pt notified via phone.

## 2016-06-18 ENCOUNTER — Encounter: Payer: No Typology Code available for payment source | Admitting: Advanced Practice Midwife

## 2016-06-18 ENCOUNTER — Ambulatory Visit (INDEPENDENT_AMBULATORY_CARE_PROVIDER_SITE_OTHER): Payer: No Typology Code available for payment source | Admitting: Obstetrics & Gynecology

## 2016-06-18 VITALS — BP 97/50 | HR 83 | Wt 128.0 lb

## 2016-06-18 DIAGNOSIS — Z3402 Encounter for supervision of normal first pregnancy, second trimester: Secondary | ICD-10-CM

## 2016-06-18 NOTE — Patient Instructions (Signed)
Contraception Choices Contraception (birth control) is the use of any methods or devices to prevent pregnancy. Below are some methods to help avoid pregnancy. HORMONAL METHODS   Contraceptive implant. This is a thin, plastic tube containing progesterone hormone. It does not contain estrogen hormone. Your health care provider inserts the tube in the inner part of the upper arm. The tube can remain in place for up to 3 years. After 3 years, the implant must be removed. The implant prevents the ovaries from releasing an egg (ovulation), thickens the cervical mucus to prevent sperm from entering the uterus, and thins the lining of the inside of the uterus.  Progesterone-only injections. These injections are given every 3 months by your health care provider to prevent pregnancy. This synthetic progesterone hormone stops the ovaries from releasing eggs. It also thickens cervical mucus and changes the uterine lining. This makes it harder for sperm to survive in the uterus.  Birth control pills. These pills contain estrogen and progesterone hormone. They work by preventing the ovaries from releasing eggs (ovulation). They also cause the cervical mucus to thicken, preventing the sperm from entering the uterus. Birth control pills are prescribed by a health care provider.Birth control pills can also be used to treat heavy periods.  Minipill. This type of birth control pill contains only the progesterone hormone. They are taken every day of each month and must be prescribed by your health care provider.  Birth control patch. The patch contains hormones similar to those in birth control pills. It must be changed once a week and is prescribed by a health care provider.  Vaginal ring. The ring contains hormones similar to those in birth control pills. It is left in the vagina for 3 weeks, removed for 1 week, and then a new one is put back in place. The patient must be comfortable inserting and removing the ring  from the vagina.A health care provider's prescription is necessary.  Emergency contraception. Emergency contraceptives prevent pregnancy after unprotected sexual intercourse. This pill can be taken right after sex or up to 5 days after unprotected sex. It is most effective the sooner you take the pills after having sexual intercourse. Most emergency contraceptive pills are available without a prescription. Check with your pharmacist. Do not use emergency contraception as your only form of birth control. BARRIER METHODS   Female condom. This is a thin sheath (latex or rubber) that is worn over the penis during sexual intercourse. It can be used with spermicide to increase effectiveness.  Female condom. This is a soft, loose-fitting sheath that is put into the vagina before sexual intercourse.  Diaphragm. This is a soft, latex, dome-shaped barrier that must be fitted by a health care provider. It is inserted into the vagina, along with a spermicidal jelly. It is inserted before intercourse. The diaphragm should be left in the vagina for 6 to 8 hours after intercourse.  Cervical cap. This is a round, soft, latex or plastic cup that fits over the cervix and must be fitted by a health care provider. The cap can be left in place for up to 48 hours after intercourse.  Sponge. This is a soft, circular piece of polyurethane foam. The sponge has spermicide in it. It is inserted into the vagina after wetting it and before sexual intercourse.  Spermicides. These are chemicals that kill or block sperm from entering the cervix and uterus. They come in the form of creams, jellies, suppositories, foam, or tablets. They do not require a   prescription. They are inserted into the vagina with an applicator before having sexual intercourse. The process must be repeated every time you have sexual intercourse. INTRAUTERINE CONTRACEPTION  Intrauterine device (IUD). This is a T-shaped device that is put in a woman's uterus  during a menstrual period to prevent pregnancy. There are 2 types:  Copper IUD. This type of IUD is wrapped in copper wire and is placed inside the uterus. Copper makes the uterus and fallopian tubes produce a fluid that kills sperm. It can stay in place for 10 years.  Hormone IUD. This type of IUD contains the hormone progestin (synthetic progesterone). The hormone thickens the cervical mucus and prevents sperm from entering the uterus, and it also thins the uterine lining to prevent implantation of a fertilized egg. The hormone can weaken or kill the sperm that get into the uterus. It can stay in place for 3-5 years, depending on which type of IUD is used. PERMANENT METHODS OF CONTRACEPTION  Female tubal ligation. This is when the woman's fallopian tubes are surgically sealed, tied, or blocked to prevent the egg from traveling to the uterus.  Hysteroscopic sterilization. This involves placing a small coil or insert into each fallopian tube. Your doctor uses a technique called hysteroscopy to do the procedure. The device causes scar tissue to form. This results in permanent blockage of the fallopian tubes, so the sperm cannot fertilize the egg. It takes about 3 months after the procedure for the tubes to become blocked. You must use another form of birth control for these 3 months.  Female sterilization. This is when the female has the tubes that carry sperm tied off (vasectomy).This blocks sperm from entering the vagina during sexual intercourse. After the procedure, the man can still ejaculate fluid (semen). NATURAL PLANNING METHODS  Natural family planning. This is not having sexual intercourse or using a barrier method (condom, diaphragm, cervical cap) on days the woman could become pregnant.  Calendar method. This is keeping track of the length of each menstrual cycle and identifying when you are fertile.  Ovulation method. This is avoiding sexual intercourse during ovulation.  Symptothermal  method. This is avoiding sexual intercourse during ovulation, using a thermometer and ovulation symptoms.  Post-ovulation method. This is timing sexual intercourse after you have ovulated. Regardless of which type or method of contraception you choose, it is important that you use condoms to protect against the transmission of sexually transmitted infections (STIs). Talk with your health care provider about which form of contraception is most appropriate for you.   This information is not intended to replace advice given to you by your health care provider. Make sure you discuss any questions you have with your health care provider.   Document Released: 11/19/2005 Document Revised: 11/24/2013 Document Reviewed: 05/14/2013 Elsevier Interactive Patient Education 2016 [employer] Levonorgestrel intrauterine device (IUD) What is this medicine? LEVONORGESTREL IUD (LEE voe nor jes trel) is a contraceptive (birth control) device. The device is placed inside the uterus by a healthcare professional. It is used to prevent pregnancy and can also be used to treat heavy bleeding that occurs during your period. Depending on the device, it can be used for 3 to 5 years. This medicine may be used for other purposes; ask your health care provider or pharmacist if you have questions. What should I tell my health care provider before I take this medicine? They need to know if you have any of these conditions: -abnormal Pap smear -cancer of the breast, uterus,  or cervix -diabetes -endometritis -genital or pelvic infection now or in the past -have more than one sexual partner or your partner has more than one partner -heart disease -history of an ectopic or tubal pregnancy -immune system problems -IUD in place -liver disease or tumor -problems with blood clots or take blood-thinners -use intravenous drugs -uterus of unusual shape -vaginal bleeding that has not been explained -an unusual or allergic  reaction to levonorgestrel, other hormones, silicone, or polyethylene, medicines, foods, dyes, or preservatives -pregnant or trying to get pregnant -breast-feeding How should I use this medicine? This device is placed inside the uterus by a health care professional. Talk to your pediatrician regarding the use of this medicine in children. Special care may be needed. Overdosage: If you think you have taken too much of this medicine contact a poison control center or emergency room at once. NOTE: This medicine is only for you. Do not share this medicine with others. What if I miss a dose? This does not apply. What may interact with this medicine? Do not take this medicine with any of the following medications: -amprenavir -bosentan -fosamprenavir This medicine may also interact with the following medications: -aprepitant -barbiturate medicines for inducing sleep or treating seizures -bexarotene -griseofulvin -medicines to treat seizures like carbamazepine, ethotoin, felbamate, oxcarbazepine, phenytoin, topiramate -modafinil -pioglitazone -rifabutin -rifampin -rifapentine -some medicines to treat HIV infection like atazanavir, indinavir, lopinavir, nelfinavir, tipranavir, ritonavir -St. John's wort -warfarin This list may not describe all possible interactions. Give your health care provider a list of all the medicines, herbs, non-prescription drugs, or dietary supplements you use. Also tell them if you smoke, drink alcohol, or use illegal drugs. Some items may interact with your medicine. What should I watch for while using this medicine? Visit your doctor or health care professional for regular check ups. See your doctor if you or your partner has sexual contact with others, becomes HIV positive, or gets a sexual transmitted disease. This product does not protect you against HIV infection (AIDS) or other sexually transmitted diseases. You can check the placement of the IUD yourself by  reaching up to the top of your vagina with clean fingers to feel the threads. Do not pull on the threads. It is a good habit to check placement after each menstrual period. Call your doctor right away if you feel more of the IUD than just the threads or if you cannot feel the threads at all. The IUD may come out by itself. You may become pregnant if the device comes out. If you notice that the IUD has come out use a backup birth control method like condoms and call your health care provider. Using tampons will not change the position of the IUD and are okay to use during your period. What side effects may I notice from receiving this medicine? Side effects that you should report to your doctor or health care professional as soon as possible: -allergic reactions like skin rash, itching or hives, swelling of the face, lips, or tongue -fever, flu-like symptoms -genital sores -high blood pressure -no menstrual period for 6 weeks during use -pain, swelling, warmth in the leg -pelvic pain or tenderness -severe or sudden headache -signs of pregnancy -stomach cramping -sudden shortness of breath -trouble with balance, talking, or walking -unusual vaginal bleeding, discharge -yellowing of the eyes or skin Side effects that usually do not require medical attention (report to your doctor or health care professional if they continue or are bothersome): -acne -breast pain -change in  sex drive or performance -changes in weight -cramping, dizziness, or faintness while the device is being inserted -headache -irregular menstrual bleeding within first 3 to 6 months of use -nausea This list may not describe all possible side effects. Call your doctor for medical advice about side effects. You may report side effects to FDA at 1-800-FDA-1088. Where should I keep my medicine? This does not apply. NOTE: This sheet is a summary. It may not cover all possible information. If you have questions about this  medicine, talk to your doctor, pharmacist, or health care provider.    2016, Elsevier/Gold Standard. (2011-12-20 13:54:04)

## 2016-06-18 NOTE — Progress Notes (Signed)
Subjective:  Dana Eaton is a 30 y.o. G1P0 at 1437w6d being seen today for ongoing prenatal care.  She is currently monitored for the following issues for this low-risk pregnancy and has Family history of osteoporosis and Supervision of normal first pregnancy, antepartum on her problem list.  Patient reports no complaints.  Contractions: Not present. Vag. Bleeding: None.  Movement: Present. Denies leaking of fluid.   The following portions of the patient's history were reviewed and updated as appropriate: allergies, current medications, past family history, past medical history, past social history, past surgical history and problem list. Problem list updated.  Objective:   Filed Vitals:   06/18/16 1525  BP: 97/50  Pulse: 83  Weight: 128 lb (58.06 kg)    Fetal Status: Fetal Heart Rate (bpm): 124 Fundal Height: 25 cm Movement: Present     General:  Alert, oriented and cooperative. Patient is in no acute distress.  Skin: Skin is warm and dry. No rash noted.   Cardiovascular: Normal heart rate noted  Respiratory: Normal respiratory effort, no problems with respiration noted  Abdomen: Soft, gravid, appropriate for gestational age. Pain/Pressure: Present     Pelvic:  Cervical exam deferred        Extremities: Normal range of motion.  Edema: Trace  Mental Status: Normal mood and affect. Normal behavior. Normal judgment and thought content.   Urinalysis: Urine Protein: Negative Urine Glucose: Negative  Assessment and Plan:  Pregnancy: G1P0 at 2337w6d  1. Supervision of normal first pregnancy, antepartum, second trimester Reviewed weight gain--doing well  Preterm labor symptoms and general obstetric precautions including but not limited to vaginal bleeding, contractions, leaking of fluid and fetal movement were reviewed in detail with the patient. Please refer to After Visit Summary for other counseling recommendations.  Return in about 3 weeks (around 07/09/2016).   Lesly DukesKelly H Yamina Lenis,  MD

## 2016-07-12 ENCOUNTER — Ambulatory Visit (INDEPENDENT_AMBULATORY_CARE_PROVIDER_SITE_OTHER): Payer: No Typology Code available for payment source | Admitting: Obstetrics & Gynecology

## 2016-07-12 DIAGNOSIS — Z23 Encounter for immunization: Secondary | ICD-10-CM | POA: Diagnosis not present

## 2016-07-12 DIAGNOSIS — Z3403 Encounter for supervision of normal first pregnancy, third trimester: Secondary | ICD-10-CM

## 2016-07-12 NOTE — Progress Notes (Signed)
Subjective:  Dana Eaton is a 30 y.o. G1P0 at 3644w2d being seen today for ongoing prenatal care.  She is currently monitored for the following issues for this low-risk pregnancy and has Family history of osteoporosis and Supervision of normal first pregnancy, antepartum on her problem list.  Patient reports no complaints.  Contractions: Not present. Vag. Bleeding: None.  Movement: Present. Denies leaking of fluid.   The following portions of the patient's history were reviewed and updated as appropriate: allergies, current medications, past family history, past medical history, past social history, past surgical history and problem list. Problem list updated.  Objective:   Vitals:   07/12/16 1542  BP: 96/61  Pulse: 96  Weight: 133 lb (60.3 kg)    Fetal Status: Fetal Heart Rate (bpm): 138 Fundal Height: 27 cm Movement: Present     General:  Alert, oriented and cooperative. Patient is in no acute distress.  Skin: Skin is warm and dry. No rash noted.   Cardiovascular: Normal heart rate noted  Respiratory: Normal respiratory effort, no problems with respiration noted  Abdomen: Soft, gravid, appropriate for gestational age. Pain/Pressure: Present     Pelvic:  Cervical exam deferred        Extremities: Normal range of motion.  Edema: Trace  Mental Status: Normal mood and affect. Normal behavior. Normal judgment and thought content.   Urinalysis: Urine Protein: Negative Urine Glucose: Negative  Assessment and Plan:  Pregnancy: G1P0 at 3044w2d  Tdap Glucola Labs   Preterm labor symptoms and general obstetric precautions including but not limited to vaginal bleeding, contractions, leaking of fluid and fetal movement were reviewed in detail with the patient. Please refer to After Visit Summary for other counseling recommendations.  Return in about 2 weeks (around 07/26/2016).   Lesly DukesKelly H Lameisha Schuenemann, MD

## 2016-07-12 NOTE — Addendum Note (Signed)
Addended by: Arne ClevelandHUTCHINSON, MANDY J on: 07/12/2016 04:22 PM   Modules accepted: Orders

## 2016-07-13 LAB — CBC
HEMATOCRIT: 34.5 % — AB (ref 35.0–45.0)
Hemoglobin: 11.6 g/dL — ABNORMAL LOW (ref 11.7–15.5)
MCH: 32.6 pg (ref 27.0–33.0)
MCHC: 33.6 g/dL (ref 32.0–36.0)
MCV: 96.9 fL (ref 80.0–100.0)
MPV: 10.6 fL (ref 7.5–12.5)
PLATELETS: 218 10*3/uL (ref 140–400)
RBC: 3.56 MIL/uL — ABNORMAL LOW (ref 3.80–5.10)
RDW: 12.7 % (ref 11.0–15.0)
WBC: 13 10*3/uL — ABNORMAL HIGH (ref 3.8–10.8)

## 2016-07-13 LAB — HIV ANTIBODY (ROUTINE TESTING W REFLEX): HIV: NONREACTIVE

## 2016-07-13 LAB — RPR

## 2016-07-13 LAB — GLUCOSE TOLERANCE, 1 HOUR (50G) W/O FASTING: GLUCOSE, 1 HR, GESTATIONAL: 96 mg/dL (ref <140)

## 2016-07-16 ENCOUNTER — Telehealth: Payer: Self-pay

## 2016-07-16 NOTE — Telephone Encounter (Signed)
Spoke with pt and she is aware of her GTT results

## 2016-07-30 ENCOUNTER — Ambulatory Visit (INDEPENDENT_AMBULATORY_CARE_PROVIDER_SITE_OTHER): Payer: No Typology Code available for payment source | Admitting: Advanced Practice Midwife

## 2016-07-30 VITALS — BP 104/65 | HR 84 | Wt 135.0 lb

## 2016-07-30 DIAGNOSIS — B9689 Other specified bacterial agents as the cause of diseases classified elsewhere: Secondary | ICD-10-CM

## 2016-07-30 DIAGNOSIS — O26893 Other specified pregnancy related conditions, third trimester: Secondary | ICD-10-CM

## 2016-07-30 DIAGNOSIS — A499 Bacterial infection, unspecified: Secondary | ICD-10-CM

## 2016-07-30 DIAGNOSIS — Z3493 Encounter for supervision of normal pregnancy, unspecified, third trimester: Secondary | ICD-10-CM

## 2016-07-30 DIAGNOSIS — N898 Other specified noninflammatory disorders of vagina: Secondary | ICD-10-CM | POA: Diagnosis not present

## 2016-07-30 DIAGNOSIS — Z3403 Encounter for supervision of normal first pregnancy, third trimester: Secondary | ICD-10-CM

## 2016-07-30 DIAGNOSIS — N760 Acute vaginitis: Secondary | ICD-10-CM

## 2016-07-30 NOTE — Patient Instructions (Signed)
Braxton Hicks Contractions °Contractions of the uterus can occur throughout pregnancy. Contractions are not always a sign that you are in labor.  °WHAT ARE BRAXTON HICKS CONTRACTIONS?  °Contractions that occur before labor are called Braxton Hicks contractions, or false labor. Toward the end of pregnancy (32-34 weeks), these contractions can develop more often and may become more forceful. This is not true labor because these contractions do not result in opening (dilatation) and thinning of the cervix. They are sometimes difficult to tell apart from true labor because these contractions can be forceful and people have different pain tolerances. You should not feel embarrassed if you go to the hospital with false labor. Sometimes, the only way to tell if you are in true labor is for your health care provider to look for changes in the cervix. °If there are no prenatal problems or other health problems associated with the pregnancy, it is completely safe to be sent home with false labor and await the onset of true labor. °HOW CAN YOU TELL THE DIFFERENCE BETWEEN TRUE AND FALSE LABOR? °False Labor °· The contractions of false labor are usually shorter and not as hard as those of true labor.   °· The contractions are usually irregular.   °· The contractions are often felt in the front of the lower abdomen and in the groin.   °· The contractions may go away when you walk around or change positions while lying down.   °· The contractions get weaker and are shorter lasting as time goes on.   °· The contractions do not usually become progressively stronger, regular, and closer together as with true labor.   °True Labor °· Contractions in true labor last 30-70 seconds, become very regular, usually become more intense, and increase in frequency.   °· The contractions do not go away with walking.   °· The discomfort is usually felt in the top of the uterus and spreads to the lower abdomen and low back.   °· True labor can be  determined by your health care provider with an exam. This will show that the cervix is dilating and getting thinner.   °WHAT TO REMEMBER °· Keep up with your usual exercises and follow other instructions given by your health care provider.   °· Take medicines as directed by your health care provider.   °· Keep your regular prenatal appointments.   °· Eat and drink lightly if you think you are going into labor.   °· If Braxton Hicks contractions are making you uncomfortable:   °¨ Change your position from lying down or resting to walking, or from walking to resting.   °¨ Sit and rest in a tub of warm water.   °¨ Drink 2-3 glasses of water. Dehydration may cause these contractions.   °¨ Do slow and deep breathing several times an hour.   °WHEN SHOULD I SEEK IMMEDIATE MEDICAL CARE? °Seek immediate medical care if: °· Your contractions become stronger, more regular, and closer together.   °· You have fluid leaking or gushing from your vagina.   °· You have a fever.   °· You pass blood-tinged mucus.   °· You have vaginal bleeding.   °· You have continuous abdominal pain.   °· You have low back pain that you never had before.   °· You feel your baby's head pushing down and causing pelvic pressure.   °· Your baby is not moving as much as it used to.   °  °This information is not intended to replace advice given to you by your health care provider. Make sure you discuss any questions you have with your health care   provider. °  °Document Released: 11/19/2005 Document Revised: 11/24/2013 Document Reviewed: 08/31/2013 °Elsevier Interactive Patient Education ©2016 [employer] Inc[employer] ° °

## 2016-07-30 NOTE — Progress Notes (Signed)
Subjective:    Dana Eaton is a 30 y.o. G1P0 4910w6d being seen today for her obstetrical visit.  Patient reports increased vaginal dicharge. Denies VB or LOF. Marland Kitchen. Fetal movement: normal.  Objective:    BP 104/65   Pulse 84   Wt 135 lb (61.2 kg)   LMP 12/27/2015   BMI 23.17 kg/m   Physical Exam  Constitutional: She appears well-developed and well-nourished. No distress.  Cardiovascular: Normal rate.   Respiratory: Effort normal.  GI: There is no tenderness.  Genitourinary: Uterus normal. No erythema, tenderness or bleeding in the vagina. Vaginal discharge (scant, thin, creamy, odorless discharge) found.  Skin: Skin is warm and dry.   Cervix visually long and closed,.  Maternal Exam:  Introitus: Vagina is positive for vaginal discharge (scant, thin, creamy, odorless discharge).     FHT: Fetal Heart Rate (bpm): 136  Uterine Size: Fundal Height: 31 cm  Presentation:       Assessment:    Pregnancy:  G1P0    Plan:    Patient Active Problem List   Diagnosis Date Noted  . Supervision of normal first pregnancy, antepartum 02/21/2016  . Family history of osteoporosis 08/17/2014  1. Vaginal discharge during pregnancy in third trimester  discussed safe exercise in pregnancy Follow up in 2 Weeks.

## 2016-07-31 ENCOUNTER — Telehealth: Payer: Self-pay

## 2016-07-31 ENCOUNTER — Other Ambulatory Visit: Payer: Self-pay

## 2016-07-31 LAB — WET PREP, GENITAL
Trich, Wet Prep: NONE SEEN
YEAST WET PREP: NONE SEEN

## 2016-07-31 MED ORDER — METRONIDAZOLE 500 MG PO TABS: 500.0000 mg | ORAL_TABLET | Freq: Two times a day (BID) | ORAL | 0 refills | Status: DC

## 2016-07-31 NOTE — Telephone Encounter (Signed)
I left a message on pt's phone to call the office for results

## 2016-08-01 ENCOUNTER — Telehealth: Payer: Self-pay

## 2016-08-01 MED ORDER — METRONIDAZOLE 500 MG PO TABS: 500.0000 mg | ORAL_TABLET | Freq: Two times a day (BID) | ORAL | 0 refills | Status: DC

## 2016-08-01 NOTE — Telephone Encounter (Signed)
Spoke with pt and she is aware that she has been diagnosed with BV and that medication has been sent to the pharmacy for her

## 2016-08-01 NOTE — Addendum Note (Signed)
Addended by: Dorathy KinsmanSMITH, Yadiel Aubry on: 08/01/2016 03:37 PM   Modules accepted: Orders

## 2016-08-13 ENCOUNTER — Ambulatory Visit (INDEPENDENT_AMBULATORY_CARE_PROVIDER_SITE_OTHER): Payer: No Typology Code available for payment source | Admitting: Advanced Practice Midwife

## 2016-08-13 VITALS — BP 119/79 | HR 101 | Wt 140.0 lb

## 2016-08-13 DIAGNOSIS — Z3493 Encounter for supervision of normal pregnancy, unspecified, third trimester: Secondary | ICD-10-CM

## 2016-08-13 DIAGNOSIS — Z3403 Encounter for supervision of normal first pregnancy, third trimester: Secondary | ICD-10-CM

## 2016-08-13 DIAGNOSIS — Z789 Other specified health status: Secondary | ICD-10-CM

## 2016-08-13 MED ORDER — BREAST PUMP MISC: 0 refills | Status: DC

## 2016-08-13 NOTE — Patient Instructions (Signed)

## 2016-08-13 NOTE — Progress Notes (Signed)
   PRENATAL VISIT NOTE  Subjective:  Dana Eaton is a 30 y.o. G1P0 at 6870w6d being seen today for ongoing prenatal care.  She is currently monitored for the following issues for this low-risk pregnancy and has Family history of osteoporosis and Supervision of normal first pregnancy, antepartum on her problem list.  Patient reports no complaints.  Was Tx'd for BV at last visit. Vaginal discharge decreased, but is still present. No LOF, odor or itching. Contractions: Irritability. Vag. Bleeding: None.  Movement: Present. Denies leaking of fluid.   The following portions of the patient's history were reviewed and updated as appropriate: allergies, current medications, past family history, past medical history, past social history, past surgical history and problem list. Problem list updated.  Objective:   Vitals:   08/13/16 0812  BP: 119/79  Pulse: (!) 101  Weight: 140 lb (63.5 kg)    Fetal Status: Fetal Heart Rate (bpm): 143 Fundal Height: 32 cm Movement: Present  Presentation: Vertex  General:  Alert, oriented and cooperative. Patient is in no acute distress.  Skin: Skin is warm and dry. No rash noted.   Cardiovascular: Normal heart rate noted  Respiratory: Normal respiratory effort, no problems with respiration noted  Abdomen: Soft, gravid, appropriate for gestational age. Pain/Pressure: Present     Pelvic:  Cervical exam deferred        Extremities: Normal range of motion.  Edema: Trace  Mental Status: Normal mood and affect. Normal behavior. Normal judgment and thought content.   Urinalysis: Urine Protein: Negative Urine Glucose: Negative  Assessment and Plan:  Pregnancy: G1P0 at 8070w6d  1. Supervision of normal pregnancy, third trimester   2. Exclusively breastfeed infant  - Misc. Devices (BREAST PUMP) MISC; Dispense one breast pump for patient  Dispense: 1 each; Refill: 0  Preterm labor symptoms and general obstetric precautions including but not limited to vaginal  bleeding, contractions, leaking of fluid and fetal movement were reviewed in detail with the patient. Please refer to After Visit Summary for other counseling recommendations.  We recheck vaginal discharge PRN at [facility].  Return in 2 weeks (on 08/27/2016).  Dorathy KinsmanVirginia Angell Honse, CNM

## 2016-08-18 ENCOUNTER — Other Ambulatory Visit: Payer: Self-pay | Admitting: Family

## 2016-08-18 DIAGNOSIS — O99012 Anemia complicating pregnancy, second trimester: Secondary | ICD-10-CM

## 2016-08-27 ENCOUNTER — Ambulatory Visit (INDEPENDENT_AMBULATORY_CARE_PROVIDER_SITE_OTHER): Payer: No Typology Code available for payment source | Admitting: Certified Nurse Midwife

## 2016-08-27 ENCOUNTER — Encounter: Payer: Self-pay | Admitting: Certified Nurse Midwife

## 2016-08-27 VITALS — BP 105/68 | HR 87 | Wt 139.0 lb

## 2016-08-27 DIAGNOSIS — R609 Edema, unspecified: Secondary | ICD-10-CM | POA: Insufficient documentation

## 2016-08-27 DIAGNOSIS — O26899 Other specified pregnancy related conditions, unspecified trimester: Secondary | ICD-10-CM

## 2016-08-27 DIAGNOSIS — G5600 Carpal tunnel syndrome, unspecified upper limb: Secondary | ICD-10-CM

## 2016-08-27 DIAGNOSIS — Z3403 Encounter for supervision of normal first pregnancy, third trimester: Secondary | ICD-10-CM

## 2016-08-27 NOTE — Progress Notes (Signed)
Subjective:  Dana Eaton is a 30 y.o. G1P0 at 3135w6d being seen today for ongoing prenatal care.  She is currently monitored for the following issues for this low-risk pregnancy and has Family history of osteoporosis; Supervision of normal first pregnancy, antepartum; Carpal tunnel syndrome during pregnancy; and Dependent edema on her problem list.  Patient reports swelling in feet x1 week, R>L and tingling and numbness in bilateral hands, worse in am also started 1 week ago..  Contractions: Irritability. Vag. Bleeding: None.  Movement: Present. Denies leaking of fluid.   The following portions of the patient's history were reviewed and updated as appropriate: allergies, current medications, past family history, past medical history, past social history, past surgical history and problem list. Problem list updated.  Objective:   Vitals:   08/27/16 0825  BP: 105/68  Pulse: 87  Weight: 139 lb (63 kg)    Fetal Status: Fetal Heart Rate (bpm): 144   Movement: Present     General:  Alert, oriented and cooperative. Patient is in no acute distress.  Skin: Skin is warm and dry. No rash noted.   Cardiovascular: Normal heart rate noted  Respiratory: Normal respiratory effort, no problems with respiration noted  Abdomen: Soft, gravid, appropriate for gestational age. Pain/Pressure: Absent     Pelvic: Vag. Bleeding: None Vag D/C Character: Thin   Cervical exam deferred        Extremities: Normal range of motion.  Edema: Mild pitting, slight indentation  Mental Status: Normal mood and affect. Normal behavior. Normal judgment and thought content.   Urinalysis: Urine Protein: Trace Urine Glucose: Negative  Assessment and Plan:  Pregnancy: G1P0 at 6935w6d  1. Supervision of normal first pregnancy, antepartum, third trimester -GBS nv -discussed labor precautions, where to present  2. Carpal tunnel syndrome during pregnancy -bilateral wrist splints at hs  3. Dependent edema -compression  stocking, knee high -increase water intake -elevate legs  Preterm labor symptoms and general obstetric precautions including but not limited to vaginal bleeding, contractions, leaking of fluid and fetal movement were reviewed in detail with the patient. Please refer to After Visit Summary for other counseling recommendations.  Return in about 2 weeks (around 09/10/2016).   Donette LarryMelanie Aleck Locklin, CNM

## 2016-08-27 NOTE — Progress Notes (Signed)
Pt. Declined flu shot 

## 2016-09-10 ENCOUNTER — Ambulatory Visit (INDEPENDENT_AMBULATORY_CARE_PROVIDER_SITE_OTHER): Payer: No Typology Code available for payment source | Admitting: Certified Nurse Midwife

## 2016-09-10 ENCOUNTER — Other Ambulatory Visit: Payer: Self-pay | Admitting: Certified Nurse Midwife

## 2016-09-10 VITALS — BP 112/73 | HR 105 | Wt 141.0 lb

## 2016-09-10 DIAGNOSIS — Z3483 Encounter for supervision of other normal pregnancy, third trimester: Secondary | ICD-10-CM

## 2016-09-10 DIAGNOSIS — Z23 Encounter for immunization: Secondary | ICD-10-CM

## 2016-09-10 DIAGNOSIS — Z3403 Encounter for supervision of normal first pregnancy, third trimester: Secondary | ICD-10-CM

## 2016-09-10 DIAGNOSIS — Z113 Encounter for screening for infections with a predominantly sexual mode of transmission: Secondary | ICD-10-CM | POA: Diagnosis not present

## 2016-09-10 DIAGNOSIS — Z3400 Encounter for supervision of normal first pregnancy, unspecified trimester: Secondary | ICD-10-CM

## 2016-09-10 LAB — OB RESULTS CONSOLE GC/CHLAMYDIA
Chlamydia: NEGATIVE
Gonorrhea: NEGATIVE

## 2016-09-10 LAB — OB RESULTS CONSOLE GBS: STREP GROUP B AG: NEGATIVE

## 2016-09-10 NOTE — Progress Notes (Addendum)
Subjective:  Dana Eaton is a 30 y.o. G1P0 at 5968w6d being seen today for ongoing prenatal care.  She is currently monitored for the following issues for this low-risk pregnancy and has Family history of osteoporosis; Supervision of normal first pregnancy, antepartum; Carpal tunnel syndrome during pregnancy; and Dependent edema on her problem list.  Patient reports no complaints.  Contractions: Irritability. Vag. Bleeding: None.  Movement: Present. Denies leaking of fluid.   The following portions of the patient's history were reviewed and updated as appropriate: allergies, current medications, past family history, past medical history, past social history, past surgical history and problem list. Problem list updated.  Objective:   Vitals:   09/10/16 0818  BP: 112/73  Pulse: (!) 105  Weight: 141 lb (64 kg)    Fetal Status: Fetal Heart Rate (bpm): 157 Fundal Height: 35 cm Movement: Present  Presentation: Vertex  General:  Alert, oriented and cooperative. Patient is in no acute distress.  Skin: Skin is warm and dry. No rash noted.   Cardiovascular: Normal heart rate noted  Respiratory: Normal respiratory effort, no problems with respiration noted  Abdomen: Soft, gravid, appropriate for gestational age. Pain/Pressure: Absent     Pelvic: Vag. Bleeding: None Vag D/C Character: Thin   Cervical exam deferred        Extremities: Normal range of motion.  Edema: Trace  Mental Status: Normal mood and affect. Normal behavior. Normal judgment and thought content.   Urinalysis: Urine Protein: Negative Urine Glucose: Negative  Assessment and Plan:  Pregnancy: G1P0 at 6368w6d  1. Supervision of normal first pregnancy  Term labor symptoms and general obstetric precautions including but not limited to vaginal bleeding, contractions, leaking of fluid and fetal movement were reviewed in detail with the patient. Please refer to After Visit Summary for other counseling recommendations.  Return in  about 1 week (around 09/17/2016).   Donette LarryMelanie Macoy Rodwell, CNM

## 2016-09-10 NOTE — Addendum Note (Signed)
Addended by: Kathie DikeSOLA, Chava Dulac J on: 09/10/2016 08:54 AM   Modules accepted: Orders

## 2016-09-11 LAB — GC/CHLAMYDIA PROBE AMP
CT PROBE, AMP APTIMA: NOT DETECTED
GC PROBE AMP APTIMA: NOT DETECTED

## 2016-09-13 LAB — CULTURE, BETA STREP (GROUP B ONLY)

## 2016-09-17 ENCOUNTER — Ambulatory Visit (INDEPENDENT_AMBULATORY_CARE_PROVIDER_SITE_OTHER): Payer: No Typology Code available for payment source | Admitting: Advanced Practice Midwife

## 2016-09-17 VITALS — Wt 141.0 lb

## 2016-09-17 DIAGNOSIS — Z3403 Encounter for supervision of normal first pregnancy, third trimester: Secondary | ICD-10-CM | POA: Diagnosis not present

## 2016-09-17 NOTE — Progress Notes (Signed)
   PRENATAL VISIT NOTE  Subjective:  Dana Eaton is a 30 y.o. G1P0 at 7439w6d being seen today for ongoing prenatal care.  She is currently monitored for the following issues for this low-risk pregnancy and has Family history of osteoporosis; Supervision of normal first pregnancy, antepartum; Carpal tunnel syndrome during pregnancy; and Dependent edema on her problem list.  Patient reports occasional contractions and decreased fetal mvmt.  Contractions: Irritability. Vag. Bleeding: None.  Movement: Present. Denies leaking of fluid.   The following portions of the patient's history were reviewed and updated as appropriate: allergies, current medications, past family history, past medical history, past social history, past surgical history and problem list. Problem list updated.  Objective:   Vitals:   09/17/16 0816  Weight: 141 lb (64 kg)    Fetal Status: Fetal Heart Rate (bpm): 149   Movement: Present    NST reactive General:  Alert, oriented and cooperative. Patient is in no acute distress.  Skin: Skin is warm and dry. No rash noted.   Cardiovascular: Normal heart rate noted  Respiratory: Normal respiratory effort, no problems with respiration noted  Abdomen: Soft, gravid, appropriate for gestational age. Pain/Pressure: Present     Pelvic:  Cervical exam performed      0/0/-3  Extremities: Normal range of motion.  Edema: Trace  Mental Status: Normal mood and affect. Normal behavior. Normal judgment and thought content.   Assessment and Plan:  Pregnancy: G1P0 at 2339w6d  1. Encounter for supervision of normal first pregnancy in third trimester  2. Decreased fetal mvmt  - NST  Term labor symptoms and general obstetric precautions including but not limited to vaginal bleeding, contractions, leaking of fluid and fetal movement were reviewed in detail with the patient. Please refer to After Visit Summary for other counseling recommendations.  Return in 1 week (on  09/24/2016).  Dorathy KinsmanVirginia Toya Palacios, CNM

## 2016-09-17 NOTE — Patient Instructions (Signed)
Braxton Hicks Contractions °Contractions of the uterus can occur throughout pregnancy. Contractions are not always a sign that you are in labor.  °WHAT ARE BRAXTON HICKS CONTRACTIONS?  °Contractions that occur before labor are called Braxton Hicks contractions, or false labor. Toward the end of pregnancy (32-34 weeks), these contractions can develop more often and may become more forceful. This is not true labor because these contractions do not result in opening (dilatation) and thinning of the cervix. They are sometimes difficult to tell apart from true labor because these contractions can be forceful and people have different pain tolerances. You should not feel embarrassed if you go to the hospital with false labor. Sometimes, the only way to tell if you are in true labor is for your health care provider to look for changes in the cervix. °If there are no prenatal problems or other health problems associated with the pregnancy, it is completely safe to be sent home with false labor and await the onset of true labor. °HOW CAN YOU TELL THE DIFFERENCE BETWEEN TRUE AND FALSE LABOR? °False Labor °· The contractions of false labor are usually shorter and not as hard as those of true labor.   °· The contractions are usually irregular.   °· The contractions are often felt in the front of the lower abdomen and in the groin.   °· The contractions may go away when you walk around or change positions while lying down.   °· The contractions get weaker and are shorter lasting as time goes on.   °· The contractions do not usually become progressively stronger, regular, and closer together as with true labor.   °True Labor °· Contractions in true labor last 30-70 seconds, become very regular, usually become more intense, and increase in frequency.   °· The contractions do not go away with walking.   °· The discomfort is usually felt in the top of the uterus and spreads to the lower abdomen and low back.   °· True labor can be  determined by your health care provider with an exam. This will show that the cervix is dilating and getting thinner.   °WHAT TO REMEMBER °· Keep up with your usual exercises and follow other instructions given by your health care provider.   °· Take medicines as directed by your health care provider.   °· Keep your regular prenatal appointments.   °· Eat and drink lightly if you think you are going into labor.   °· If Braxton Hicks contractions are making you uncomfortable:   °¨ Change your position from lying down or resting to walking, or from walking to resting.   °¨ Sit and rest in a tub of warm water.   °¨ Drink 2-3 glasses of water. Dehydration may cause these contractions.   °¨ Do slow and deep breathing several times an hour.   °WHEN SHOULD I SEEK IMMEDIATE MEDICAL CARE? °Seek immediate medical care if: °· Your contractions become stronger, more regular, and closer together.   °· You have fluid leaking or gushing from your vagina.   °· You have a fever.   °· You pass blood-tinged mucus.   °· You have vaginal bleeding.   °· You have continuous abdominal pain.   °· You have low back pain that you never had before.   °· You feel your baby's head pushing down and causing pelvic pressure.   °· Your baby is not moving as much as it used to.   °  °This information is not intended to replace advice given to you by your health care provider. Make sure you discuss any questions you have with your health care   provider. °  °Document Released: 11/19/2005 Document Revised: 11/24/2013 Document Reviewed: 08/31/2013 °Elsevier Interactive Patient Education ©2016 [employer] Inc[employer] ° °

## 2016-09-24 ENCOUNTER — Encounter: Payer: Self-pay | Admitting: Advanced Practice Midwife

## 2016-09-24 ENCOUNTER — Ambulatory Visit (INDEPENDENT_AMBULATORY_CARE_PROVIDER_SITE_OTHER): Payer: No Typology Code available for payment source | Admitting: Advanced Practice Midwife

## 2016-09-24 VITALS — BP 111/67 | HR 87 | Wt 144.0 lb

## 2016-09-24 DIAGNOSIS — Z3403 Encounter for supervision of normal first pregnancy, third trimester: Secondary | ICD-10-CM

## 2016-09-24 NOTE — Patient Instructions (Signed)
Vaginal Delivery °During delivery, your health care provider will help you give birth to your baby. During a vaginal delivery, you will work to push the baby out of your vagina. However, before you can push your baby out, a few things need to happen. The opening of your uterus (cervix) has to soften, thin out, and open up (dilate) all the way to 10 cm. Also, your baby has to move down from the uterus into your vagina.  °SIGNS OF LABOR  °Your health care provider will first need to make sure you are in labor. Signs of labor include:  °· Passing what is called the mucous plug before labor begins. This is a small amount of blood-stained mucus. °· Having regular, painful uterine contractions.   °· The time between contractions gets shorter.   °· The discomfort and pain gradually get more intense. °· Contraction pains get worse when walking and do not go away when resting.   °· Your cervix becomes thinner (effacement) and dilates. °BEFORE THE DELIVERY °Once you are in labor and admitted into the hospital or care center, your health care provider may do the following:  °· Perform a complete physical exam. °· Review any complications related to pregnancy or labor.  °· Check your blood pressure, pulse, temperature, and heart rate (vital signs).   °· Determine if, and when, the rupture of amniotic membranes occurred. °· Do a vaginal exam (using a sterile glove and lubricant) to determine:   °¨ The position (presentation) of the baby. Is the baby's head presenting first (vertex) in the birth canal (vagina), or are the feet or buttocks first (breech)?   °¨ The level (station) of the baby's head within the birth canal.   °¨ The effacement and dilatation of the cervix.   °· An electronic fetal monitor is usually placed on your abdomen when you first arrive. This is used to monitor your contractions and the baby's heart rate. °¨ When the monitor is on your abdomen (external fetal monitor), it can only pick up the frequency and  length of your contractions. It cannot tell the strength of your contractions. °¨ If it becomes necessary for your health care provider to know exactly how strong your contractions are or to see exactly what the baby's heart rate is doing, an internal monitor may be inserted into your vagina and uterus. Your health care provider will discuss the benefits and risks of using an internal monitor and obtain your permission before inserting the device. °¨ Continuous fetal monitoring may be needed if you have an epidural, are receiving certain medicines (such as oxytocin), or have pregnancy or labor complications. °· An IV access tube may be placed into a vein in your arm to deliver fluids and medicines if necessary. °THREE STAGES OF LABOR AND DELIVERY °Normal labor and delivery is divided into three stages. °First Stage °This stage starts when you begin to contract regularly and your cervix begins to efface and dilate. It ends when your cervix is completely open (fully dilated). The first stage is the longest stage of labor and can last from 3 hours to 15 hours.  °Several methods are available to help with labor pain. You and your health care provider will decide which option is best for you. Options include:  °· Opioid medicines. These are strong pain medicines that you can get through your IV tube or as a shot into your muscle. These medicines lessen pain but do not make it go away completely.  °· Epidural. A medicine is given through a thin tube that   is inserted in your back. The medicine numbs the lower part of your body and prevents any pain in that area. °· Paracervical pain medicine. This is an injection of an anesthetic on each side of your cervix.   °· You may request natural childbirth, which does not involve the use of pain medicines or an epidural during labor and delivery. Instead, you will use other things, such as breathing exercises, to help cope with the pain. °Second Stage °The second stage of labor  begins when your cervix is fully dilated at 10 cm. It continues until you push your baby down through the birth canal and the baby is born. This stage can take only minutes or several hours. °· The location of your baby's head as it moves through the birth canal is reported as a number called a station. If the baby's head has not started its descent, the station is described as being at minus 3 (-3). When your baby's head is at the zero station, it is at the middle of the birth canal and is engaged in the pelvis. The station of your baby helps indicate the progress of the second stage of labor. °· When your baby is born, your health care provider may hold the baby with his or her head lowered to prevent amniotic fluid, mucus, and blood from getting into the baby's lungs. The baby's mouth and nose may be suctioned with a small bulb syringe to remove any additional fluid. °· Your health care provider may then place the baby on your stomach. It is important to keep the baby from getting cold. To do this, the health care provider will dry the baby off, place the baby directly on your skin (with no blankets between you and the baby), and cover the baby with warm, dry blankets.   °· The umbilical cord is cut. °Third Stage °During the third stage of labor, your health care provider will deliver the placenta (afterbirth) and make sure your bleeding is under control. The delivery of the placenta usually takes about 5 minutes but can take up to 30 minutes. After the placenta is delivered, a medicine may be given either by IV or injection to help contract the uterus and control bleeding. If you are planning to breastfeed, you can try to do so now. °After you deliver the placenta, your uterus should contract and get very firm. If your uterus does not remain firm, your health care provider will massage it. This is important because the contraction of the uterus helps cut off bleeding at the site where the placenta was attached  to your uterus. If your uterus does not contract properly and stay firm, you may continue to bleed heavily. If there is a lot of bleeding, medicines may be given to contract the uterus and stop the bleeding.  °  °This information is not intended to replace advice given to you by your health care provider. Make sure you discuss any questions you have with your health care provider. °  °Document Released: 08/28/2008 Document Revised: 12/10/2014 Document Reviewed: 07/16/2012 °Elsevier Interactive Patient Education ©2016 [employer] Inc. ° °

## 2016-09-25 ENCOUNTER — Encounter: Payer: Self-pay | Admitting: *Deleted

## 2016-09-25 ENCOUNTER — Encounter: Payer: Self-pay | Admitting: Advanced Practice Midwife

## 2016-09-25 NOTE — Progress Notes (Signed)
   PRENATAL VISIT NOTE  Subjective:  Dana Eaton is a 30 y.o. G1P0 at 6335w0d being seen today for ongoing prenatal care.  She is currently monitored for the following issues for this low-risk pregnancy and has Family history of osteoporosis; Supervision of normal first pregnancy, antepartum; Carpal tunnel syndrome during pregnancy; and Dependent edema on her problem list.  Patient reports occasional contractions.  Contractions: Irritability. Vag. Bleeding: None.  Movement: Present. Denies leaking of fluid.   The following portions of the patient's history were reviewed and updated as appropriate: allergies, current medications, past family history, past medical history, past social history, past surgical history and problem list. Problem list updated.  Objective:   Vitals:   09/24/16 0814  BP: 111/67  Pulse: 87  Weight: 144 lb (65.3 kg)    Fetal Status: Fetal Heart Rate (bpm): 164   Movement: Present     General:  Alert, oriented and cooperative. Patient is in no acute distress.  Skin: Skin is warm and dry. No rash noted.   Cardiovascular: Normal heart rate noted  Respiratory: Normal respiratory effort, no problems with respiration noted  Abdomen: Soft, gravid, appropriate for gestational age. Pain/Pressure: Present     Pelvic:  Cervical exam deferred        Extremities: Normal range of motion.  Edema: Trace  Mental Status: Normal mood and affect. Normal behavior. Normal judgment and thought content.   Assessment and Plan:  Pregnancy: G1P0 at 6435w0d  1. Encounter for supervision of normal first pregnancy in third trimester      Labor precautions discussed, where to go for evaluation  Term labor symptoms and general obstetric precautions including but not limited to vaginal bleeding, contractions, leaking of fluid and fetal movement were reviewed in detail with the patient. Please refer to After Visit Summary for other counseling recommendations.  Return in about 1 week  (around 10[place]01/2016[place] [facility]  Aviva SignsMarie L Tramain Gershman, CNM

## 2016-10-01 ENCOUNTER — Ambulatory Visit (INDEPENDENT_AMBULATORY_CARE_PROVIDER_SITE_OTHER): Payer: No Typology Code available for payment source | Admitting: Advanced Practice Midwife

## 2016-10-01 VITALS — BP 108/60 | HR 83 | Wt 145.0 lb

## 2016-10-01 DIAGNOSIS — Z3403 Encounter for supervision of normal first pregnancy, third trimester: Secondary | ICD-10-CM | POA: Diagnosis not present

## 2016-10-01 NOTE — Patient Instructions (Signed)
Vaginal Delivery °During delivery, your health care provider will help you give birth to your baby. During a vaginal delivery, you will work to push the baby out of your vagina. However, before you can push your baby out, a few things need to happen. The opening of your uterus (cervix) has to soften, thin out, and open up (dilate) all the way to 10 cm. Also, your baby has to move down from the uterus into your vagina.  °SIGNS OF LABOR  °Your health care provider will first need to make sure you are in labor. Signs of labor include:  °· Passing what is called the mucous plug before labor begins. This is a small amount of blood-stained mucus. °· Having regular, painful uterine contractions.   °· The time between contractions gets shorter.   °· The discomfort and pain gradually get more intense. °· Contraction pains get worse when walking and do not go away when resting.   °· Your cervix becomes thinner (effacement) and dilates. °BEFORE THE DELIVERY °Once you are in labor and admitted into the hospital or care center, your health care provider may do the following:  °· Perform a complete physical exam. °· Review any complications related to pregnancy or labor.  °· Check your blood pressure, pulse, temperature, and heart rate (vital signs).   °· Determine if, and when, the rupture of amniotic membranes occurred. °· Do a vaginal exam (using a sterile glove and lubricant) to determine:   °¨ The position (presentation) of the baby. Is the baby's head presenting first (vertex) in the birth canal (vagina), or are the feet or buttocks first (breech)?   °¨ The level (station) of the baby's head within the birth canal.   °¨ The effacement and dilatation of the cervix.   °· An electronic fetal monitor is usually placed on your abdomen when you first arrive. This is used to monitor your contractions and the baby's heart rate. °¨ When the monitor is on your abdomen (external fetal monitor), it can only pick up the frequency and  length of your contractions. It cannot tell the strength of your contractions. °¨ If it becomes necessary for your health care provider to know exactly how strong your contractions are or to see exactly what the baby's heart rate is doing, an internal monitor may be inserted into your vagina and uterus. Your health care provider will discuss the benefits and risks of using an internal monitor and obtain your permission before inserting the device. °¨ Continuous fetal monitoring may be needed if you have an epidural, are receiving certain medicines (such as oxytocin), or have pregnancy or labor complications. °· An IV access tube may be placed into a vein in your arm to deliver fluids and medicines if necessary. °THREE STAGES OF LABOR AND DELIVERY °Normal labor and delivery is divided into three stages. °First Stage °This stage starts when you begin to contract regularly and your cervix begins to efface and dilate. It ends when your cervix is completely open (fully dilated). The first stage is the longest stage of labor and can last from 3 hours to 15 hours.  °Several methods are available to help with labor pain. You and your health care provider will decide which option is best for you. Options include:  °· Opioid medicines. These are strong pain medicines that you can get through your IV tube or as a shot into your muscle. These medicines lessen pain but do not make it go away completely.  °· Epidural. A medicine is given through a thin tube that   is inserted in your back. The medicine numbs the lower part of your body and prevents any pain in that area. °· Paracervical pain medicine. This is an injection of an anesthetic on each side of your cervix.   °· You may request natural childbirth, which does not involve the use of pain medicines or an epidural during labor and delivery. Instead, you will use other things, such as breathing exercises, to help cope with the pain. °Second Stage °The second stage of labor  begins when your cervix is fully dilated at 10 cm. It continues until you push your baby down through the birth canal and the baby is born. This stage can take only minutes or several hours. °· The location of your baby's head as it moves through the birth canal is reported as a number called a station. If the baby's head has not started its descent, the station is described as being at minus 3 (-3). When your baby's head is at the zero station, it is at the middle of the birth canal and is engaged in the pelvis. The station of your baby helps indicate the progress of the second stage of labor. °· When your baby is born, your health care provider may hold the baby with his or her head lowered to prevent amniotic fluid, mucus, and blood from getting into the baby's lungs. The baby's mouth and nose may be suctioned with a small bulb syringe to remove any additional fluid. °· Your health care provider may then place the baby on your stomach. It is important to keep the baby from getting cold. To do this, the health care provider will dry the baby off, place the baby directly on your skin (with no blankets between you and the baby), and cover the baby with warm, dry blankets.   °· The umbilical cord is cut. °Third Stage °During the third stage of labor, your health care provider will deliver the placenta (afterbirth) and make sure your bleeding is under control. The delivery of the placenta usually takes about 5 minutes but can take up to 30 minutes. After the placenta is delivered, a medicine may be given either by IV or injection to help contract the uterus and control bleeding. If you are planning to breastfeed, you can try to do so now. °After you deliver the placenta, your uterus should contract and get very firm. If your uterus does not remain firm, your health care provider will massage it. This is important because the contraction of the uterus helps cut off bleeding at the site where the placenta was attached  to your uterus. If your uterus does not contract properly and stay firm, you may continue to bleed heavily. If there is a lot of bleeding, medicines may be given to contract the uterus and stop the bleeding.  °  °This information is not intended to replace advice given to you by your health care provider. Make sure you discuss any questions you have with your health care provider. °  °Document Released: 08/28/2008 Document Revised: 12/10/2014 Document Reviewed: 07/16/2012 °Elsevier Interactive Patient Education ©2016 [employer] Inc. ° °

## 2016-10-02 NOTE — Progress Notes (Signed)
Addendum to note on 10/01/16  Did an NST since she will be 40wks on 10/02/16 NST reactive Irregular contractions  Aviva SignsMarie L Luba Matzen, CNM

## 2016-10-03 ENCOUNTER — Encounter (HOSPITAL_COMMUNITY): Payer: Self-pay

## 2016-10-03 ENCOUNTER — Inpatient Hospital Stay (HOSPITAL_COMMUNITY)
Admission: AD | Admit: 2016-10-03 | Discharge: 2016-10-03 | Disposition: A | Discharge status: Home / Self Care | Payer: No Typology Code available for payment source | Source: Ambulatory Visit | Attending: Obstetrics & Gynecology | Admitting: Obstetrics & Gynecology

## 2016-10-03 ENCOUNTER — Inpatient Hospital Stay (HOSPITAL_COMMUNITY)
Admission: AD | Admit: 2016-10-03 | Discharge: 2016-10-05 | DRG: 775 | Disposition: A | Discharge status: Home / Self Care | Payer: No Typology Code available for payment source | Source: Ambulatory Visit | Attending: Obstetrics & Gynecology | Admitting: Obstetrics & Gynecology

## 2016-10-03 ENCOUNTER — Encounter (HOSPITAL_COMMUNITY): Payer: Self-pay | Admitting: *Deleted

## 2016-10-03 DIAGNOSIS — Z3A40 40 weeks gestation of pregnancy: Secondary | ICD-10-CM

## 2016-10-03 DIAGNOSIS — Z3403 Encounter for supervision of normal first pregnancy, third trimester: Secondary | ICD-10-CM | POA: Diagnosis present

## 2016-10-03 DIAGNOSIS — Z3400 Encounter for supervision of normal first pregnancy, unspecified trimester: Secondary | ICD-10-CM

## 2016-10-03 DIAGNOSIS — O26899 Other specified pregnancy related conditions, unspecified trimester: Principal | ICD-10-CM

## 2016-10-03 DIAGNOSIS — G5600 Carpal tunnel syndrome, unspecified upper limb: Secondary | ICD-10-CM

## 2016-10-03 DIAGNOSIS — R609 Edema, unspecified: Secondary | ICD-10-CM

## 2016-10-03 HISTORY — DX: Anemia, unspecified: D64.9

## 2016-10-03 LAB — URINE MICROSCOPIC-ADD ON

## 2016-10-03 LAB — URINALYSIS, ROUTINE W REFLEX MICROSCOPIC
Bilirubin Urine: NEGATIVE
Glucose, UA: NEGATIVE mg/dL
KETONES UR: NEGATIVE mg/dL
Leukocytes, UA: NEGATIVE
NITRITE: NEGATIVE
PROTEIN: NEGATIVE mg/dL
Specific Gravity, Urine: 1.015 (ref 1.005–1.030)
pH: 6 (ref 5.0–8.0)

## 2016-10-03 LAB — CBC
HCT: 36.4 % (ref 36.0–46.0)
Hemoglobin: 12.7 g/dL (ref 12.0–15.0)
MCH: 32.6 pg (ref 26.0–34.0)
MCHC: 34.9 g/dL (ref 30.0–36.0)
MCV: 93.3 fL (ref 78.0–100.0)
Platelets: 234 10*3/uL (ref 150–400)
RBC: 3.9 MIL/uL (ref 3.87–5.11)
RDW: 12.9 % (ref 11.5–15.5)
WBC: 15.7 10*3/uL — ABNORMAL HIGH (ref 4.0–10.5)

## 2016-10-03 LAB — ABO/RH: ABO/RH(D): A POS

## 2016-10-03 LAB — TYPE AND SCREEN
ABO/RH(D): A POS
Antibody Screen: NEGATIVE

## 2016-10-03 MED ORDER — SOD CITRATE-CITRIC ACID 500-334 MG/5ML PO SOLN: 30.0000 mL | ORAL | Status: DC | PRN

## 2016-10-03 MED ORDER — FLEET ENEMA 7-19 GM/118ML RE ENEM
1.0000 | ENEMA | RECTAL | Status: DC | PRN
Start: 2016-10-03 — End: 2016-10-04

## 2016-10-03 MED ORDER — OXYTOCIN BOLUS FROM INFUSION
500.0000 mL | Freq: Once | INTRAVENOUS | Status: AC
  Administered 2016-10-04: 500 mL via INTRAVENOUS

## 2016-10-03 MED ORDER — OXYCODONE-ACETAMINOPHEN 5-325 MG PO TABS: 1.0000 | ORAL_TABLET | ORAL | Status: DC | PRN

## 2016-10-03 MED ORDER — ACETAMINOPHEN 325 MG PO TABS: 650.0000 mg | ORAL_TABLET | ORAL | Status: DC | PRN

## 2016-10-03 MED ORDER — LACTATED RINGERS IV SOLN
INTRAVENOUS | Status: DC
  Administered 2016-10-03: 19:00:00 via INTRAVENOUS

## 2016-10-03 MED ORDER — LACTATED RINGERS IV SOLN: 500.0000 mL | INTRAVENOUS | Status: DC | PRN

## 2016-10-03 MED ORDER — FENTANYL CITRATE (PF) 100 MCG/2ML IJ SOLN: 100.0000 ug | INTRAMUSCULAR | Status: DC | PRN

## 2016-10-03 MED ORDER — OXYTOCIN 40 UNITS IN LACTATED RINGERS INFUSION - SIMPLE MED
2.5000 [IU]/h | INTRAVENOUS | Status: DC
  Filled 2016-10-03: qty 1000

## 2016-10-03 MED ORDER — OXYCODONE-ACETAMINOPHEN 5-325 MG PO TABS: 2.0000 | ORAL_TABLET | ORAL | Status: DC | PRN

## 2016-10-03 MED ORDER — LIDOCAINE HCL (PF) 1 % IJ SOLN
30.0000 mL | INTRAMUSCULAR | Status: DC | PRN
  Filled 2016-10-03: qty 30

## 2016-10-03 MED ORDER — ONDANSETRON HCL 4 MG/2ML IJ SOLN: 4.0000 mg | Freq: Four times a day (QID) | INTRAMUSCULAR | Status: DC | PRN

## 2016-10-03 NOTE — MAU Note (Signed)
Patient presents with ctx 5 mins apart. Patient denies any bleeding or LOF. Fetus active 

## 2016-10-03 NOTE — MAU Note (Signed)
Had membranes stripped 2 days ago; to be scheduled for induction on Monday if labor does not start spontaneous before then;

## 2016-10-03 NOTE — H&P (Signed)
LABOR AND DELIVERY ADMISSION HISTORY AND PHYSICAL NOTE  Dana Eaton is a 30 y.o. female G1P0 with IUP at 1380w1d by 6 wk US presenting for SOL.  She reports she started contractions at 2PM this afternoon and they have continue to intensify since then.    She reports positive fetal movement. She denies leakage of fluid or vaginal bleeding.  Prenatal History/Complications:  Past Medical History: Past Medical History:  Diagnosis Date  . Anemia     Past Surgical History: Past Surgical History:  Procedure Laterality Date  . MANDIBLE SURGERY      Obstetrical History: OB History    Gravida Para Term Preterm AB Living   1             SAB TAB Ectopic Multiple Live Births                  Social History: Social History   Social History  . Marital status: Married    Spouse name: N/A  . Number of children: N/A  . Years of education: N/A   Social History Main Topics  . Smoking status: Never Smoker  . Smokeless tobacco: Never Used  . Alcohol use No  . Drug use: No  . Sexual activity: Yes    Partners: Male   Other Topics Concern  . None   Social History Narrative  . None    Family History: Family History  Problem Relation Age of Onset  . Osteoarthritis Maternal Grandmother   . Osteoporosis Maternal Grandmother   . Pancreatic cancer Maternal Grandmother 1976  . Seizures Father     Allergies: No Known Allergies  Prescriptions Prior to Admission  Medication Sig Dispense Refill Last Dose  . Docosahexaenoic Acid (DHA PO) Take by mouth.   10/02/2016  . docusate sodium (COLACE) 50 MG capsule Take 50 mg by mouth 2 (two) times daily.   10/02/2016  . ferrous sulfate 325 (65 FE) MG tablet TAKE 1 TABLET (325 MG TOTAL) BY MOUTH 2 (TWO) TIMES DAILY. 60 tablet 1 10/02/2016  . metroNIDAZOLE (FLAGYL) 500 MG tablet Take 1 tablet (500 mg total) by mouth 2 (two) times daily. 14 tablet 0 10/02/2016  . Misc. Devices (BREAST PUMP) MISC Dispense one breast pump for patient 1 each 0  10/02/2016  . Prenatal Vit-Fe Fumarate-FA (MULTIVITAMIN-PRENATAL) 27-0.8 MG TABS tablet Take 1 tablet by mouth daily at 12 noon.   10/02/2016     Review of Systems   All systems reviewed and negative except as stated in HPI  Blood pressure 133/79, pulse 82, temperature 98.4 F (36.9 C), temperature source Oral, resp. rate 20, height 5\' 4"  (1.626 m), weight 143 lb (64.9 kg), last menstrual period 12/27/2015. General appearance: alert, cooperative and appears stated age Lungs: clear to auscultation bilaterally Heart: regular rate and rhythm Abdomen: soft, non-tender; bowel sounds normal Extremities: No calf swelling or tenderness Presentation: cephalic by nursing exam Fetal monitoring: category 1 Uterine activity: contractions every 1-3 minutes Dilation: 3 Effacement (%): 90, 100 Station: -2 Exam by:: Kayren EavesAshley Garvey RN    Prenatal labs: ABO, Rh: A/POS/-- (03/20 1045) Antibody: NEG (03/20 1045) Rubella: RPR RPR: NON REAC (08/10 1622)  HBsAg: NEGATIVE (03/20 1045)  HIV: NONREACTIVE (08/10 1622)  GBS:   negative 1 hr Glucola: 96  Prenatal Transfer Tool  Maternal Diabetes: No Genetic Screening: Normal Maternal Ultrasounds/Referrals: Normal Fetal Ultrasounds or other Referrals:  None Maternal Substance Abuse:  No Significant Maternal Medications:  None Significant Maternal Lab Results: Lab values include: Group  B Strep negative  Results for orders placed or performed during the hospital encounter of 10/03/16 (from the past 24 hour(s))  CBC   Collection Time: 10/03/16  6:20 PM  Result Value Ref Range   WBC 15.7 (H) 4.0 - 10.5 K/uL   RBC 3.90 3.87 - 5.11 MIL/uL   Hemoglobin 12.7 12.0 - 15.0 g/dL   HCT 04.536.4 40.936.0 - 81.146.0 %   MCV 93.3 78.0 - 100.0 fL   MCH 32.6 26.0 - 34.0 pg   MCHC 34.9 30.0 - 36.0 g/dL   RDW 91.412.9 78.211.5 - 95.615.5 %   Platelets 234 150 - 400 K/uL  Results for orders placed or performed during the hospital encounter of 10/03/16 (from the past 24 hour(s))   Urinalysis, Routine w reflex microscopic (not at [facility])   Collection Time: 10/03/16  7:20 AM  Result Value Ref Range   Color, Urine YELLOW YELLOW   APPearance CLEAR CLEAR   Specific Gravity, Urine 1.015 1.005 - 1.030   pH 6.0 5.0 - 8.0   Glucose, UA NEGATIVE NEGATIVE mg/dL   Hgb urine dipstick SMALL (A) NEGATIVE   Bilirubin Urine NEGATIVE NEGATIVE   Ketones, ur NEGATIVE NEGATIVE mg/dL   Protein, ur NEGATIVE NEGATIVE mg/dL   Nitrite NEGATIVE NEGATIVE   Leukocytes, UA NEGATIVE NEGATIVE  Urine microscopic-add on   Collection Time: 10/03/16  7:20 AM  Result Value Ref Range   Squamous Epithelial / LPF 0-5 (A) NONE SEEN   WBC, UA 0-5 0 - 5 WBC/hpf   RBC / HPF 0-5 0 - 5 RBC/hpf   Bacteria, UA RARE (A) NONE SEEN    Patient Active Problem List   Diagnosis Date Noted  . Normal labor 10/03/2016  . Carpal tunnel syndrome during pregnancy 08/27/2016  . Dependent edema 08/27/2016  . Supervision of normal first pregnancy, antepartum 02/21/2016  . Family history of osteoporosis 08/17/2014    Assessment: Dana ProudBrittany Ancrum is a 30 y.o. G1P0 at 7113w1d here for SOL  #Labor: SOL contracting well. May have to AROM is contraction decrease  #Pain: Wishes to attempt without pain medications.  #FWB: Category 1 #ID:  GBS neg #MOF: breast #MOC: LARC  #Circ:  N/A  Ernestina PennaNicholas Schenk 10/03/2016, 6:53 PM

## 2016-10-03 NOTE — MAU Note (Signed)
C/o vaginal bleeding since 5040 this AM; c/o abdominal cramping since @ 2000 yesterday; denies SROM; c/o decreased fetal movement since 2000;

## 2016-10-03 NOTE — Discharge Instructions (Signed)
Vaginal Delivery °During delivery, your health care provider will help you give birth to your baby. During a vaginal delivery, you will work to push the baby out of your vagina. However, before you can push your baby out, a few things need to happen. The opening of your uterus (cervix) has to soften, thin out, and open up (dilate) all the way to 10 cm. Also, your baby has to move down from the uterus into your vagina.  °SIGNS OF LABOR  °Your health care provider will first need to make sure you are in labor. Signs of labor include:  °· Passing what is called the mucous plug before labor begins. This is a small amount of blood-stained mucus. °· Having regular, painful uterine contractions.   °· The time between contractions gets shorter.   °· The discomfort and pain gradually get more intense. °· Contraction pains get worse when walking and do not go away when resting.   °· Your cervix becomes thinner (effacement) and dilates. °BEFORE THE DELIVERY °Once you are in labor and admitted into the hospital or care center, your health care provider may do the following:  °· Perform a complete physical exam. °· Review any complications related to pregnancy or labor.  °· Check your blood pressure, pulse, temperature, and heart rate (vital signs).   °· Determine if, and when, the rupture of amniotic membranes occurred. °· Do a vaginal exam (using a sterile glove and lubricant) to determine:   °¨ The position (presentation) of the baby. Is the baby's head presenting first (vertex) in the birth canal (vagina), or are the feet or buttocks first (breech)?   °¨ The level (station) of the baby's head within the birth canal.   °¨ The effacement and dilatation of the cervix.   °· An electronic fetal monitor is usually placed on your abdomen when you first arrive. This is used to monitor your contractions and the baby's heart rate. °¨ When the monitor is on your abdomen (external fetal monitor), it can only pick up the frequency and  length of your contractions. It cannot tell the strength of your contractions. °¨ If it becomes necessary for your health care provider to know exactly how strong your contractions are or to see exactly what the baby's heart rate is doing, an internal monitor may be inserted into your vagina and uterus. Your health care provider will discuss the benefits and risks of using an internal monitor and obtain your permission before inserting the device. °¨ Continuous fetal monitoring may be needed if you have an epidural, are receiving certain medicines (such as oxytocin), or have pregnancy or labor complications. °· An IV access tube may be placed into a vein in your arm to deliver fluids and medicines if necessary. °THREE STAGES OF LABOR AND DELIVERY °Normal labor and delivery is divided into three stages. °First Stage °This stage starts when you begin to contract regularly and your cervix begins to efface and dilate. It ends when your cervix is completely open (fully dilated). The first stage is the longest stage of labor and can last from 3 hours to 15 hours.  °Several methods are available to help with labor pain. You and your health care provider will decide which option is best for you. Options include:  °· Opioid medicines. These are strong pain medicines that you can get through your IV tube or as a shot into your muscle. These medicines lessen pain but do not make it go away completely.  °· Epidural. A medicine is given through a thin tube that   is inserted in your back. The medicine numbs the lower part of your body and prevents any pain in that area. °· Paracervical pain medicine. This is an injection of an anesthetic on each side of your cervix.   °· You may request natural childbirth, which does not involve the use of pain medicines or an epidural during labor and delivery. Instead, you will use other things, such as breathing exercises, to help cope with the pain. °Second Stage °The second stage of labor  begins when your cervix is fully dilated at 10 cm. It continues until you push your baby down through the birth canal and the baby is born. This stage can take only minutes or several hours. °· The location of your baby's head as it moves through the birth canal is reported as a number called a station. If the baby's head has not started its descent, the station is described as being at minus 3 (-3). When your baby's head is at the zero station, it is at the middle of the birth canal and is engaged in the pelvis. The station of your baby helps indicate the progress of the second stage of labor. °· When your baby is born, your health care provider may hold the baby with his or her head lowered to prevent amniotic fluid, mucus, and blood from getting into the baby's lungs. The baby's mouth and nose may be suctioned with a small bulb syringe to remove any additional fluid. °· Your health care provider may then place the baby on your stomach. It is important to keep the baby from getting cold. To do this, the health care provider will dry the baby off, place the baby directly on your skin (with no blankets between you and the baby), and cover the baby with warm, dry blankets.   °· The umbilical cord is cut. °Third Stage °During the third stage of labor, your health care provider will deliver the placenta (afterbirth) and make sure your bleeding is under control. The delivery of the placenta usually takes about 5 minutes but can take up to 30 minutes. After the placenta is delivered, a medicine may be given either by IV or injection to help contract the uterus and control bleeding. If you are planning to breastfeed, you can try to do so now. °After you deliver the placenta, your uterus should contract and get very firm. If your uterus does not remain firm, your health care provider will massage it. This is important because the contraction of the uterus helps cut off bleeding at the site where the placenta was attached  to your uterus. If your uterus does not contract properly and stay firm, you may continue to bleed heavily. If there is a lot of bleeding, medicines may be given to contract the uterus and stop the bleeding.  °  °This information is not intended to replace advice given to you by your health care provider. Make sure you discuss any questions you have with your health care provider. °  °Document Released: 08/28/2008 Document Revised: 12/10/2014 Document Reviewed: 07/16/2012 °Elsevier Interactive Patient Education ©2016 [employer] ° °

## 2016-10-03 NOTE — Anesthesia Pain Management Evaluation Note (Signed)
  CRNA Pain Management Visit Note  Patient: Dana Eaton, 30 y.o., female  "Hello I am a member of the anesthesia team at [facility]. We have an anesthesia team available at all times to provide care throughout the hospital, including epidural management and anesthesia for C-section. I don't know your plan for the delivery whether it a natural birth, water birth, IV sedation, nitrous supplementation, doula or epidural, but we want to meet your pain goals."   1.Was your pain managed to your expectations on prior hospitalizations?   No prior hospitalizations  2.What is your expectation for pain management during this hospitalization?     Labor support without medications  3.How can we help you reach that goal? Be available if needed  Record the patient's initial score and the patient's pain goal.   Pain: 8  Pain Goal: 10 [facility] wants you to be able to say your pain was always managed very well.  Dana Eaton 10/03/2016

## 2016-10-04 ENCOUNTER — Encounter (HOSPITAL_COMMUNITY): Payer: Self-pay | Admitting: *Deleted

## 2016-10-04 DIAGNOSIS — Z3A40 40 weeks gestation of pregnancy: Secondary | ICD-10-CM

## 2016-10-04 LAB — RPR: RPR Ser Ql: NONREACTIVE

## 2016-10-04 MED ORDER — ONDANSETRON HCL 4 MG PO TABS: 4.0000 mg | ORAL_TABLET | ORAL | Status: DC | PRN

## 2016-10-04 MED ORDER — SENNOSIDES-DOCUSATE SODIUM 8.6-50 MG PO TABS
2.0000 | ORAL_TABLET | ORAL | Status: DC
  Administered 2016-10-05: 2 via ORAL
  Filled 2016-10-04: qty 2

## 2016-10-04 MED ORDER — DIBUCAINE 1 % RE OINT: 1.0000 "application " | TOPICAL_OINTMENT | RECTAL | Status: DC | PRN

## 2016-10-04 MED ORDER — ZOLPIDEM TARTRATE 5 MG PO TABS: 5.0000 mg | ORAL_TABLET | Freq: Every evening | ORAL | Status: DC | PRN

## 2016-10-04 MED ORDER — SIMETHICONE 80 MG PO CHEW: 80.0000 mg | CHEWABLE_TABLET | ORAL | Status: DC | PRN

## 2016-10-04 MED ORDER — ACETAMINOPHEN 325 MG PO TABS
650.0000 mg | ORAL_TABLET | ORAL | Status: DC | PRN
  Administered 2016-10-04: 650 mg via ORAL
  Filled 2016-10-04: qty 2

## 2016-10-04 MED ORDER — WITCH HAZEL-GLYCERIN EX PADS: 1.0000 "application " | MEDICATED_PAD | CUTANEOUS | Status: DC | PRN

## 2016-10-04 MED ORDER — BENZOCAINE-MENTHOL 20-0.5 % EX AERO
1.0000 "application " | INHALATION_SPRAY | CUTANEOUS | Status: DC | PRN
  Filled 2016-10-04: qty 56

## 2016-10-04 MED ORDER — DIPHENHYDRAMINE HCL 25 MG PO CAPS: 25.0000 mg | ORAL_CAPSULE | Freq: Four times a day (QID) | ORAL | Status: DC | PRN

## 2016-10-04 MED ORDER — IBUPROFEN 600 MG PO TABS
600.0000 mg | ORAL_TABLET | Freq: Four times a day (QID) | ORAL | Status: DC
  Administered 2016-10-04 – 2016-10-05 (×6): 600 mg via ORAL
  Filled 2016-10-04 (×6): qty 1

## 2016-10-04 MED ORDER — COCONUT OIL OIL: 1.0000 "application " | TOPICAL_OIL | Status: DC | PRN

## 2016-10-04 MED ORDER — PRENATAL 27-0.8 MG PO TABS: 1.0000 | ORAL_TABLET | Freq: Every day | ORAL | Status: DC

## 2016-10-04 MED ORDER — PRENATAL MULTIVITAMIN CH
1.0000 | ORAL_TABLET | Freq: Every day | ORAL | Status: DC
  Administered 2016-10-04 – 2016-10-05 (×2): 1 via ORAL
  Filled 2016-10-04 (×2): qty 1

## 2016-10-04 MED ORDER — ONDANSETRON HCL 4 MG/2ML IJ SOLN: 4.0000 mg | INTRAMUSCULAR | Status: DC | PRN

## 2016-10-04 MED ORDER — TETANUS-DIPHTH-ACELL PERTUSSIS 5-2.5-18.5 LF-MCG/0.5 IM SUSP: 0.5000 mL | Freq: Once | INTRAMUSCULAR | Status: DC

## 2016-10-04 NOTE — Lactation Note (Signed)
This note was copied from a baby's chart. Lactation Consultation Note  Patient Name: Girl Pershing ProudBrittany Sult Today's Date: 10/04/2016 Reason for consult: Initial assessment   Initial visit at 13 hours of life. Mom has multiple visitors in room. Mom to call out when she is ready for consult. LC brochure left in room.   Lurline HareRichey, Maygen Sirico [facility] 10/04/2016, 2:57 PM

## 2016-10-04 NOTE — Progress Notes (Signed)
Patient ID: Dana Eaton, female   DOB: [birth year], 30 y.o.   MRN: [id number]  Coping well w/ ctx; up in chair now, has been in shower x 2 VSS, afeb FHR 140s intermittently, +accels Ctx q 3 mins Cx deferred (last check 5-6cm @ 2150)  IUP@term  Active labor  Anticipate SVD  Cam HaiSHAW, KIMBERLY CNM 10/04/2016 12:23 AM

## 2016-10-04 NOTE — Progress Notes (Signed)
Patient asked to take tylenol and vitamin from home. Patient discouraged from taking any medicine not dispensed from hospital pharmacy. Patient verbalized understanding.

## 2016-10-04 NOTE — Lactation Note (Signed)
This note was copied from a baby's chart. Lactation Consultation Note  Patient Name: Dana Eaton ZOXWR'UToday's Date: 10/04/2016 Reason for consult: Follow-up assessment Baby at 14 hr of life. Baby was not cueing but mom wanted to latch baby. Baby did wake after removing her clothes and changing her diaper. Baby sucked well for about 5 minutes before falling asleep. Baby will wake and take 2-3 sucks then go back to sleep. Encouraged mom to keep the clothes and head band off baby, let her sleep sts. Baby has a recessed chin, high palate, extends tongue over gum line, but only lifts tongue to midline. Baby keeps tongue curled in the center of the mouth. Baby has wet/bubbly mouth. Discussed baby behavior, feeding frequency, baby belly size, voids, wt loss, breast changes, and nipple care. Mom is aware of lactation services and support group. She will continue to offer the breast 8+/24hr and use the waking techniques demonstrated. She will manually express and spoon feed if baby does not do well at the breast.    Maternal Data Formula Feeding for Exclusion: No Does the patient have breastfeeding experience prior to this delivery?: No  Feeding Feeding Type: Breast Fed Length of feed: 5 min  LATCH Score/Interventions Latch: Repeated attempts needed to sustain latch, nipple held in mouth throughout feeding, stimulation needed to elicit sucking reflex. Intervention(s): Adjust position;Assist with latch;Breast massage;Breast compression  Audible Swallowing: None Intervention(s): Hand expression;Skin to skin Intervention(s): Alternate breast massage  Type of Nipple: Everted at rest and after stimulation  Comfort (Breast/Nipple): Soft / non-tender     Hold (Positioning): Full assist, staff holds infant at breast Intervention(s): Position options;Support Pillows  LATCH Score: 5  Lactation Tools Discussed/Used     Consult Status Consult Status: Follow-up Date: 10/05/16 Follow-up type:  In-patient    Dana Eaton 10/04/2016, 4:20 PM

## 2016-10-05 MED ORDER — SENNOSIDES-DOCUSATE SODIUM 8.6-50 MG PO TABS: 2.0000 | ORAL_TABLET | Freq: Every day | ORAL | 0 refills | Status: DC

## 2016-10-05 MED ORDER — IBUPROFEN 600 MG PO TABS: 600.0000 mg | ORAL_TABLET | Freq: Four times a day (QID) | ORAL | 0 refills | Status: DC

## 2016-10-05 NOTE — Lactation Note (Signed)
This note was copied from a baby's chart. Lactation Consultation Note: Mother has been using a  #20 nipple shield. Mother states that infant has feed 2 times with well. Father is prefilling the nipple shield with ebm and formula. Mother has an electric pump that I explained use for them.  Mother advised to post pump after each feeding, and supplement infant. Parents are very receptive to all teaching. Parents have supplemental guideline sheet. Attempt to latch infant . Infant very sleepy. Last feeding was at 9:45.mohter advised to page lactation for feeding assessment.   Patient Name: Dana Pershing ProudBrittany Eaton WUJWJ'XToday's Date: 10/05/2016 Reason for consult: Follow-up assessment   Maternal Data    Feeding Feeding Type: Breast Fed  LATCH Score/Interventions Latch: Grasps breast easily, tongue down, lips flanged, rhythmical sucking.  Audible Swallowing: A few with stimulation  Type of Nipple: Everted at rest and after stimulation  Comfort (Breast/Nipple): Soft / non-tender     Hold (Positioning): Assistance needed to correctly position infant at breast and maintain latch.  LATCH Score: 8  Lactation Tools Discussed/Used Tools: Nipple Shields Nipple shield size: 20   Consult Status      Stevan BornKendrick, [name] 10/05/2016, 11:27 AM

## 2016-10-05 NOTE — Discharge Summary (Signed)
OB Discharge Summary     Patient Name: Dana ProudBrittany Liverman DOB: 02-Jun-1986 MRN: [id number]  Date of admission: 10/03/2016 Delivering MD: Cam HaiSHAW, KIMBERLY D   Date of discharge: 10/05/2016  Admitting diagnosis: 40w ctx last 3 hr. 5 min apart Intrauterine pregnancy: 5354w2d     Secondary diagnosis:  Active Problems:   Normal labor  Additional problems: none     Discharge diagnosis: Term Pregnancy Delivered                                                                                                Post partum procedures:none  Augmentation: none  Complications: None  Hospital course:  Onset of Labor With Vaginal Delivery     30 y.o. yo G1P1001 at 7254w2d was admitted in Active Labor on 10/03/2016. Patient had an uncomplicated labor course as follows:  Membrane Rupture Time/Date: 7:25 PM ,10/03/2016   Intrapartum Procedures: Episiotomy: None [1]                                         Lacerations:  None [1]  Patient had a delivery of a Viable infant. 10/04/2016  Information for the patient's newborn:  Koleen DistanceFreeman, Girl Nekeya [[id number]][[id number]]  Delivery Method: Vaginal, Spontaneous Delivery (Filed from Delivery Summary)    Pateint had an uncomplicated postpartum course.  She is ambulating, tolerating a regular diet, passing flatus, and urinating well. Patient is discharged home in stable condition on 10/05/16.    Physical exam Vitals:   10/04/16 0456 10/04/16 0845 10/04/16 1740 10/05/16 0544  BP: (!) 102/52 (!) 103/56 97/67 (!) 98/59  Pulse: 100 87 94 87  Resp: 16 18 18 18   Temp: 98.6 F (37 C) 97.9 F (36.6 C) 98.6 F (37 C) 98.3 F (36.8 C)  TempSrc: Oral Oral Oral Oral  SpO2: 100%     Weight:      Height:       General: alert, cooperative and no distress Lochia: appropriate Uterine Fundus: firm Incision: N/A DVT Evaluation: Calf/Ankle edema is present Labs: Lab Results  Component Value Date   WBC 15.7 (H) 10/03/2016   HGB 12.7 10/03/2016   HCT 36.4 10/03/2016   MCV  93.3 10/03/2016   PLT 234 10/03/2016   CMP Latest Ref Rng & Units 12/02/2014  Glucose 70 - 99 mg/dL 91  BUN 6 - 23 mg/dL 16  Creatinine 7.820.50 - 9.561.10 mg/dL 2.130.77  Sodium 086135 - 578145 mEq/L 137  Potassium 3.5 - 5.3 mEq/L 3.9  Chloride 96 - 112 mEq/L 102  CO2 19 - 32 mEq/L 25  Calcium 8.4 - 10.5 mg/dL 9.3  Total Protein 6.0 - 8.3 g/dL 6.9  Total Bilirubin 0.2 - 1.2 mg/dL 0.8  Alkaline Phos 39 - 117 U/L 34(L)  AST 0 - 37 U/L 16  ALT 0 - 35 U/L 14    Discharge instruction: per After Visit Summary and "Baby and Me Booklet".  After visit meds:    Medication List    TAKE these medications  docusate sodium 100 MG capsule Commonly known as:  COLACE Take 100 mg by mouth 2 (two) times daily as needed for mild constipation.   ferrous sulfate 325 (65 FE) MG tablet Take 325 mg by mouth at bedtime.   ibuprofen 600 MG tablet Commonly known as:  ADVIL,MOTRIN Take 1 tablet (600 mg total) by mouth every 6 (six) hours.   multivitamin-prenatal 27-0.8 MG Tabs tablet Take 1 tablet by mouth daily.   senna-docusate 8.6-50 MG tablet Commonly known as:  Senokot-S Take 2 tablets by mouth at bedtime.   SUPER DHA GEMS 1000 MG Caps Take 1,000 mg by mouth daily with lunch.       Diet: routine diet  Activity: Advance as tolerated. Pelvic rest for 6 weeks.   Outpatient follow up:6 weeks Follow up Appt:No future appointments. Follow up Visit:No Follow-up on file.  Postpartum contraception: Natural Family Planning, Condoms and Undecided  Newborn Data: Live born female  Birth Weight: 7 lb 12.3 oz (3524 g) APGAR: 9, 9  Baby Feeding: Breast Disposition:home with mother   10/05/2016 Tillman SersAngela C Riccio, DO   OB FELLOW DISCHARGE ATTESTATION  I have seen and examined this patient and agree with above documentation in the resident's note.   Ernestina PennaNicholas Rhapsody Wolven, MD 11:27 AM

## 2016-10-05 NOTE — Discharge Instructions (Signed)

## 2016-10-05 NOTE — Lactation Note (Signed)
This note was copied from a baby's chart. Lactation Consultation Note: Mother paged for lactation to assist with waking infant and with feeding. Infant still sleepy after wake up exercises. Infant attempt several times at the breast but no sustained latch. Infant was fed with a cruved tip syringe 10 ml by LC. Father taught how to finger feed. Infant took another 5ml for a total of 15ml. Parents have guidelines for supplementation. Reviewed plan. Mother to attempt to breastfeed with or without the nipple shield. Supplement infant with ebm or formula. Mother then plans to post pump after each feeding. Advised parents to keep good accurated output. Volumes.   Patient Name: Dana Eaton ZOXWR'UToday's Date: 10/05/2016 Reason for consult: Follow-up assessment   Maternal Data    Feeding Feeding Type: Breast Fed  LATCH Score/Interventions Latch: Grasps breast easily, tongue down, lips flanged, rhythmical sucking.  Audible Swallowing: A few with stimulation  Type of Nipple: Everted at rest and after stimulation  Comfort (Breast/Nipple): Soft / non-tender     Hold (Positioning): Assistance needed to correctly position infant at breast and maintain latch.  LATCH Score: 8  Lactation Tools Discussed/Used Tools: Nipple Shields Nipple shield size: 20   Consult Status      Dana BickersKendrick, Dana Eaton 10/05/2016, 12:28 PM

## 2016-11-14 ENCOUNTER — Ambulatory Visit (INDEPENDENT_AMBULATORY_CARE_PROVIDER_SITE_OTHER): Payer: No Typology Code available for payment source | Admitting: Obstetrics & Gynecology

## 2016-11-14 ENCOUNTER — Encounter: Payer: Self-pay | Admitting: Obstetrics & Gynecology

## 2016-11-14 VITALS — BP 121/80 | HR 68 | Resp 16 | Ht 64.0 in | Wt 120.0 lb

## 2016-11-14 DIAGNOSIS — M79645 Pain in left finger(s): Secondary | ICD-10-CM

## 2016-11-14 DIAGNOSIS — K649 Unspecified hemorrhoids: Secondary | ICD-10-CM

## 2016-11-14 DIAGNOSIS — Z30011 Encounter for initial prescription of contraceptive pills: Secondary | ICD-10-CM

## 2016-11-14 MED ORDER — NORETHINDRONE 0.35 MG PO TABS: 1.0000 | ORAL_TABLET | Freq: Every day | ORAL | 11 refills | Status: DC

## 2016-11-14 MED ORDER — HYDROCORTISONE ACE-PRAMOXINE 1-1 % RE FOAM: 1.0000 | Freq: Two times a day (BID) | RECTAL | 0 refills | Status: DC

## 2016-11-14 NOTE — Progress Notes (Signed)
Post Partum Exam  Dana Eaton is a [age] y.o. 211P1001 female who presents for a postpartum visit. She is 6 weeks postpartum following a spontaneous vaginal delivery. I have fully reviewed the prenatal and intrapartum course. The delivery was at 3073w2d gestational weeks.  Anesthesia: none. Postpartum course has been unremarkable. Baby's course has been unremarkable. Baby is feeding by breast. Bleeding no bleeding. Bowel function is normal with Senokot. Bladder function is normal. Patient is not sexually active. Contraception method is condoms. Postpartum depression screening:neg  Pt complains of left thumb pain.  Hard to carry baby.  Pt also c/o hemorrhoid pain and bleeding with BM.  The following portions of the patient's history were reviewed and updated as appropriate: allergies, current medications, past family history, past medical history, past social history, past surgical history and problem list.  Review of Systems Pertinent items noted in HPI and remainder of comprehensive ROS otherwise negative.    Objective:    BP 116/78 mmHg  Pulse 78  Resp 16  Ht 5\' 5"  (1.651 m)  Wt 211 lb (95.709 kg)  BMI 35.11 kg/m2  Breastfeeding? Yes  General:  alert, cooperative and no distress   Breasts:  inspection negative, no nipple discharge or bleeding, no masses or nodularity palpable  Lungs: clear to auscultation bilaterally  Heart:  regular rate and rhythm  Abdomen: soft, non-tender; bowel sounds normal; no masses,  no organomegaly   Vulva:  normal  Vagina: normal vagina  Cervix:  no lesions  Corpus: normal size, contour, position, consistency, mobility, non-tender  Adnexa:  no mass, fullness, tenderness  Rectal Exam: Not performed.        Assessment:   postpartum exam.    Plan:   1. Contraception: Micronor (pt understands importance of timing) 2.  Proctofoam for hemorrhoids 3.  Referral to sports med (Dr. Karie Schwalbe) for thumb

## 2016-11-14 NOTE — Progress Notes (Signed)
Postpartum visit today

## 2016-11-15 ENCOUNTER — Encounter: Payer: Self-pay | Admitting: Sports Medicine

## 2016-11-15 ENCOUNTER — Ambulatory Visit (INDEPENDENT_AMBULATORY_CARE_PROVIDER_SITE_OTHER): Payer: No Typology Code available for payment source | Admitting: Sports Medicine

## 2016-11-15 DIAGNOSIS — M654 Radial styloid tenosynovitis [de Quervain]: Secondary | ICD-10-CM

## 2016-11-15 MED ORDER — IBUPROFEN 800 MG PO TABS: 800.0000 mg | ORAL_TABLET | Freq: Three times a day (TID) | ORAL | 2 refills | Status: DC | PRN

## 2016-11-15 NOTE — Progress Notes (Signed)
  Subjective:    CC: Left wrist pain  HPI: This is a pleasant 30 year old female, she just had a baby for the past few weeks she's had increasing pain over the radial aspect of her left wrist with radiation into her thumb, moderate, persistent, worse with resisted radial deviation of the wrist.    Past medical history:  Negative.  See flowsheet/record as well for more information.  Surgical history: Negative.  See flowsheet/record as well for more information.  Family history: Negative.  See flowsheet/record as well for more information.  Social history: Negative.  See flowsheet/record as well for more information.  Allergies, and medications have been entered into the medical record, reviewed, and no changes needed.   Review of Systems: No fevers, chills, night sweats, weight loss, chest pain, or shortness of breath.   Objective:    General: Well Developed, well nourished, and in no acute distress.  Neuro: Alert and oriented x3, extra-ocular muscles intact, sensation grossly intact.  HEENT: Normocephalic, atraumatic, pupils equal round reactive to light, neck supple, no masses, no lymphadenopathy, thyroid nonpalpable.  Skin: Warm and dry, no rashes. Cardiac: Regular rate and rhythm, no murmurs rubs or gallops, no lower extremity edema.  Respiratory: Clear to auscultation bilaterally. Not using accessory muscles, speaking in full sentences. Left Wrist: Visibly swollen over the first extensor compartment ROM smooth and normal with good flexion and extension and ulnar/radial deviation that is symmetrical with opposite wrist. Palpation is normal over metacarpals, navicular, lunate, and TFCC; tendons without tenderness/ swelling No snuffbox tenderness. No tenderness over Canal of Guyon. Positive Finkelstein sign, negative Tinel's and negative Phalen signs. Negative Watson's test.  Impression and Recommendations:    De Quervain's tenosynovitis, left Thumb spica brace for 2 weeks,  ibuprofen. Rehabilitation exercises given.

## 2016-11-15 NOTE — Assessment & Plan Note (Signed)
Thumb spica brace for 2 weeks, ibuprofen. Rehabilitation exercises given.

## 2016-11-16 ENCOUNTER — Ambulatory Visit: Payer: No Typology Code available for payment source | Admitting: Sports Medicine

## 2017-01-27 ENCOUNTER — Other Ambulatory Visit: Payer: Self-pay | Admitting: Family

## 2017-01-27 DIAGNOSIS — O99012 Anemia complicating pregnancy, second trimester: Secondary | ICD-10-CM

## 2017-04-02 ENCOUNTER — Ambulatory Visit (INDEPENDENT_AMBULATORY_CARE_PROVIDER_SITE_OTHER): Payer: No Typology Code available for payment source | Admitting: Family Medicine

## 2017-04-02 ENCOUNTER — Encounter: Payer: Self-pay | Admitting: Family Medicine

## 2017-04-02 VITALS — BP 108/55 | HR 76 | Temp 98.4°F | Ht 64.0 in | Wt 113.0 lb

## 2017-04-02 DIAGNOSIS — K120 Recurrent oral aphthae: Secondary | ICD-10-CM

## 2017-04-02 DIAGNOSIS — J029 Acute pharyngitis, unspecified: Secondary | ICD-10-CM | POA: Diagnosis not present

## 2017-04-02 NOTE — Patient Instructions (Signed)
Use Tylenol and Motrin as needed for fever and pain relief. We will call you once a swab is available. If anything changes please let us know or if you're still running a fever after 3 days them please let us know.

## 2017-04-02 NOTE — Progress Notes (Signed)
Subjective:    Patient ID: Dana Eaton, female    DOB: 20-Apr-1986, 31 y.o.   MRN: [id number]  HPI 31 year old healthy female with no significant past medical history comes in today complaining of Sore throat and fever that started a couple days ago. She says about a week ago she's noticed to canker sores underneath her tongue. One on the right side up on the left side. She says they were quite uncomfortable and then starting about 2 days ago she started having a sore throat and then a fever that started yesterday. In fact her highest temperature was 103, this a.m. and she took some Motrin at 645.Marland Kitchen She [facility] at [facility] and they did a swab on her for strep throat which was negative but they did send a culture. That is pending. She denies anything GI upset nausea or vomiting or diarrhea. She denies any significant nasal congestion. She has had a little pressure in both ears with a little bit of popping and a bad headache today.   Review of Systems   BP (!) 108/55   Pulse 76   Temp 98.4 F (36.9 C) Comment: took motrin  this AM  Ht  (1.626 m)   Wt 113 lb (51.3 kg)   SpO2 98%   BMI 19.40 kg/m     No Known Allergies  Past Medical History:  Diagnosis Date  . Anemia     Past Surgical History:  Procedure Laterality Date  . MANDIBLE SURGERY      Social History   Social History  . Marital status: Married    Spouse name: N/A  . Number of children: N/A  . Years of education: N/A   Occupational History  . Not on file.   Social History Main Topics  . Smoking status: Never Smoker  . Smokeless tobacco: Never Used  . Alcohol use No  . Drug use: No  . Sexual activity: Yes    Partners: Male   Other Topics Concern  . Not on file   Social History Narrative  . No narrative on file    Family History  Problem Relation Age of Onset  . Osteoarthritis Maternal Grandmother   . Osteoporosis Maternal Grandmother   . Pancreatic cancer Maternal  Grandmother 64  . Seizures Father     Outpatient Encounter Prescriptions as of 04/02/2017  Medication Sig  . ferrous sulfate 325 (65 FE) MG tablet TAKE 1 TABLET (325 MG TOTAL) BY MOUTH 2 (TWO) TIMES DAILY.  Marland Kitchen Omega-3 Fatty Acids (SUPER DHA GEMS) 1000 MG CAPS Take 1,000 mg by mouth daily with lunch.  . [DISCONTINUED] ferrous sulfate 325 (65 FE) MG tablet Take 325 mg by mouth at bedtime.  . [DISCONTINUED] hydrocortisone-pramoxine (PROCTOFOAM HC) rectal foam Place 1 applicator rectally 2 (two) times daily.  . [DISCONTINUED] ibuprofen (ADVIL,MOTRIN) 800 MG tablet Take 1 tablet (800 mg total) by mouth every 8 (eight) hours as needed.  . [DISCONTINUED] norethindrone (MICRONOR,CAMILA,ERRIN) 0.35 MG tablet Take 1 tablet (0.35 mg total) by mouth daily.  . [DISCONTINUED] Prenatal Vit-Fe Fumarate-FA (MULTIVITAMIN-PRENATAL) 27-0.8 MG TABS tablet Take 1 tablet by mouth daily.   . [DISCONTINUED] senna-docusate (SENOKOT-S) 8.6-50 MG tablet Take 2 tablets by mouth at bedtime.   No facility-administered encounter medications on file as of 04/02/2017.           Objective:   Physical Exam  Constitutional: She is oriented to person, place, and time. She appears well-developed and well-nourished.  HENT:  Head: Normocephalic and atraumatic.  Right Ear: External ear normal.  Left Ear: External ear normal.  Nose: Nose normal.  Mouth/Throat: Oropharynx is clear and moist.  TMs and canals are clear. She has 2 ulcers underneath her time at the base of the mouth. One is approximately 1 cm on the left side and oval-shaped and white. The one on the right side is just a little bit larger maybe 1.2 cm again oval-shaped and white. She has some fine erythematous macules on the posterior pharynx.   Eyes: Conjunctivae and EOM are normal. Pupils are equal, round, and reactive to light.  Neck: Neck supple. No thyromegaly present.  He does have some significant anterior cervical lymphadenopathy.  Cardiovascular: Normal  rate, regular rhythm and normal heart sounds.   Pulmonary/Chest: Effort normal and breath sounds normal. She has no wheezes.  Lymphadenopathy:    She has cervical adenopathy.  Neurological: She is alert and oriented to person, place, and time.  Skin: Skin is warm and dry.  Psychiatric: She has a normal mood and affect.       Assessment & Plan:  Acute pharyngitis-suspect viral cause especially with triggering of the oral ulcers. I did go ahead and swab the ulcers today for herpes simplex. She is married and has one sexual partner. She does have a daughter in daycare. Recommend Motrin Tylenol as needed for fever and pain relief. Will call with results once available.  apthous ulcer-did swab for HSV today.

## 2017-04-08 LAB — RFLXH. SIMPLEX/VZ VIRUS CULT/DIF

## 2017-04-11 LAB — VIRAL CULTURE VIRC

## 2017-05-20 IMAGING — US US MFM FETAL NUCHAL TRANSLUCENCY
1 series · 15 of 28 positions shown · non-contrast
Comparison: none

[Series 1: us mfm fetal nuchal translucency · 15 of 28 slices shown]
[im 1/28]
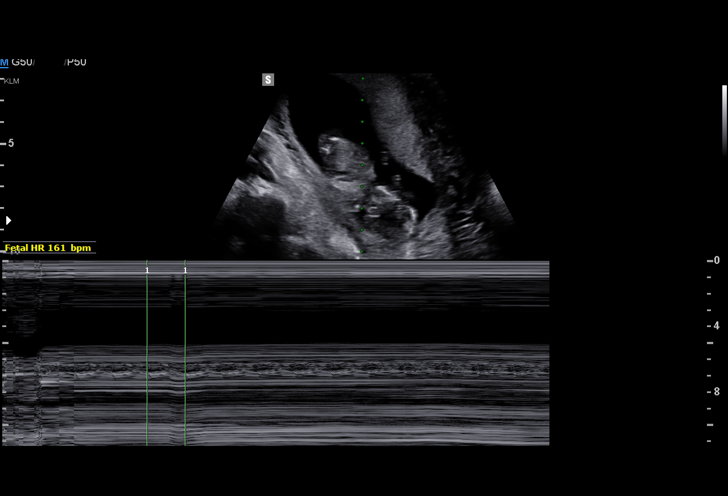
[im 3/28]
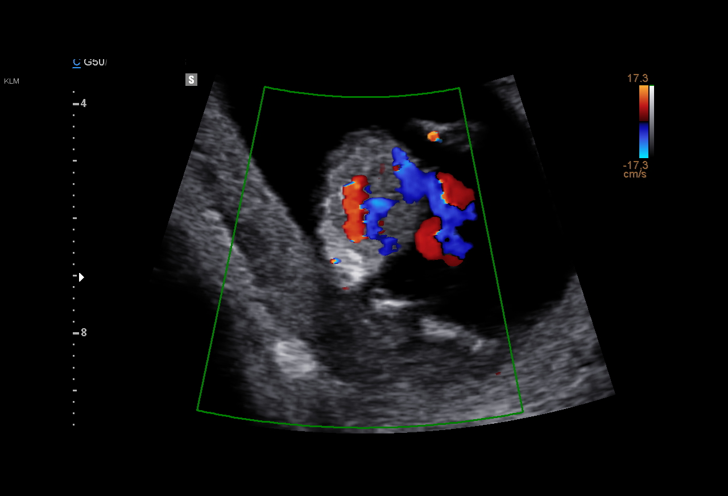
[im 5/28]
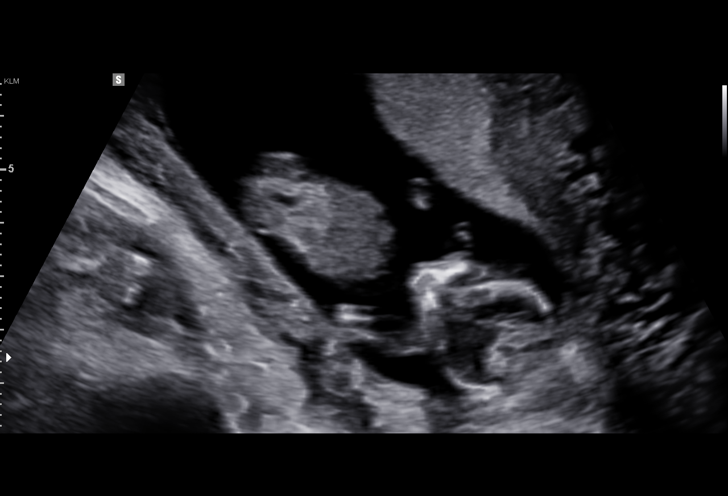
[im 7/28]
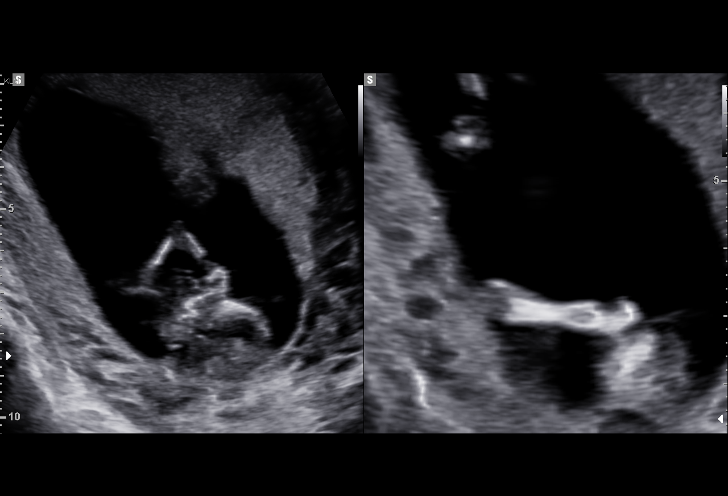
[im 9/28]
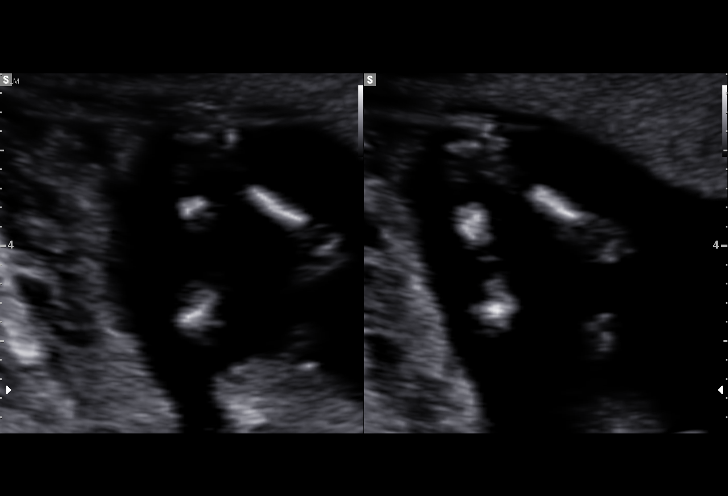
[im 11/28]
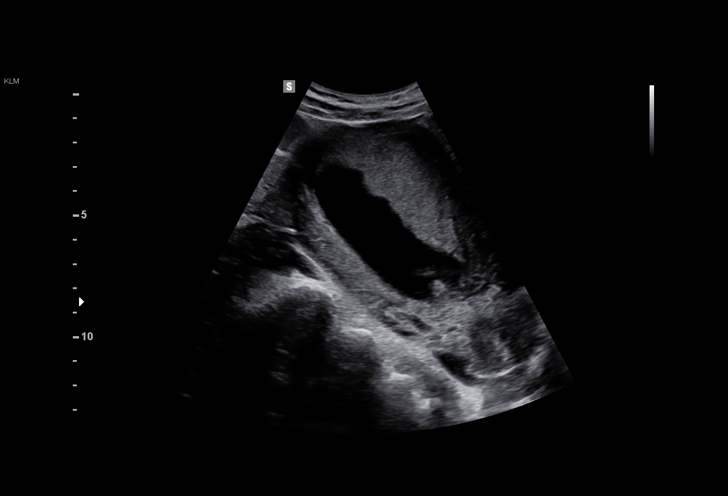
[im 13/28]
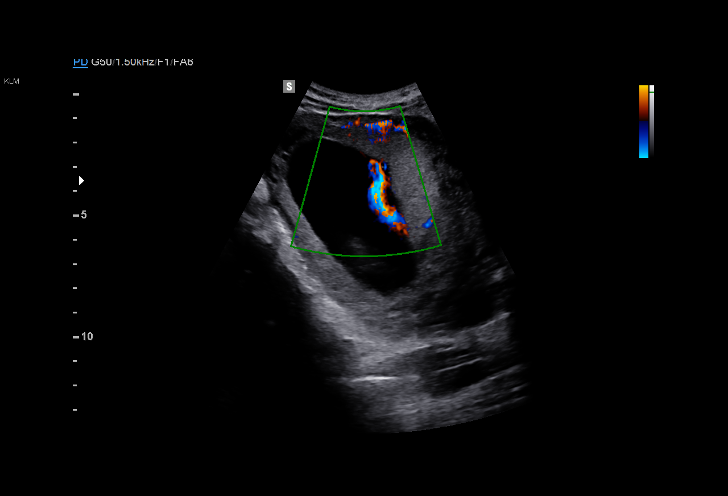
[im 15/28]
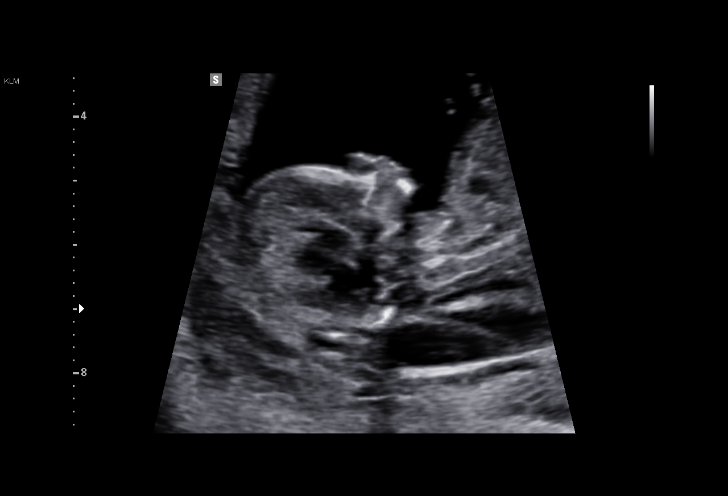
[im 16/28]
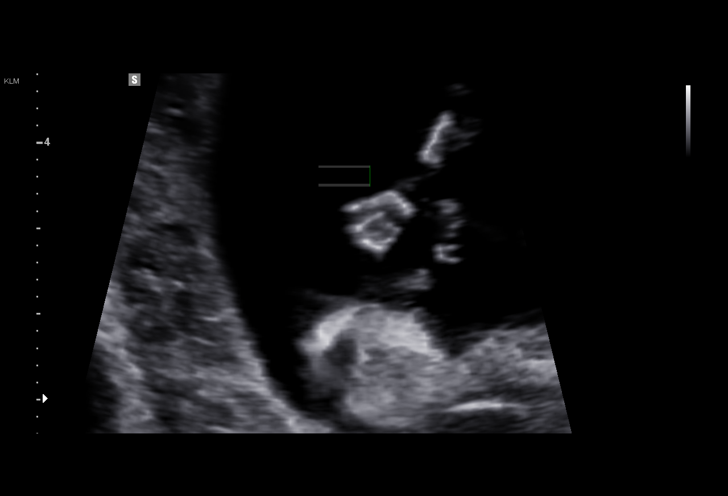
[im 18/28]
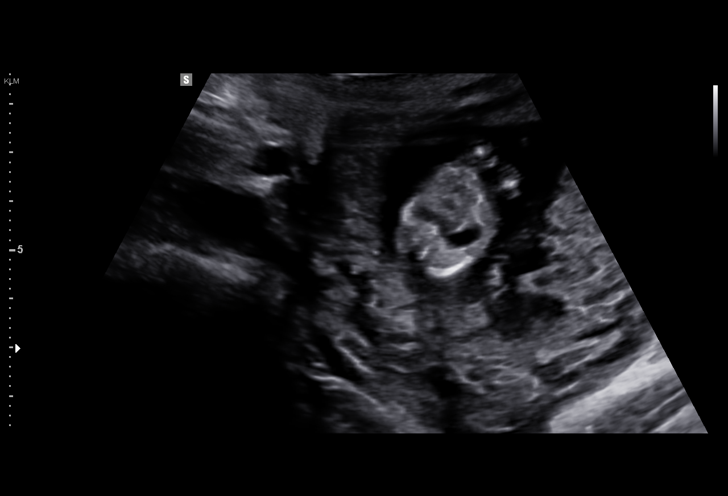
[im 20/28]
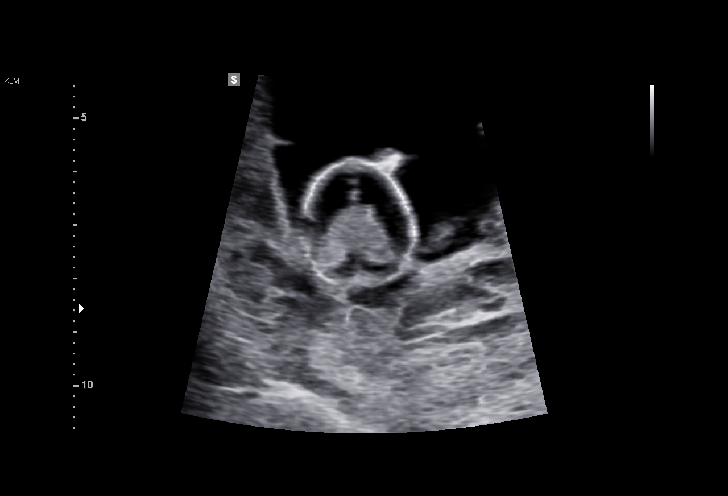
[im 22/28]
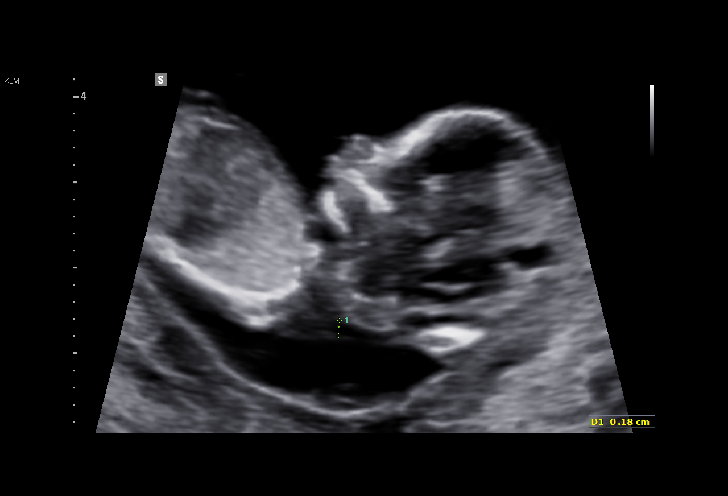
[im 24/28]
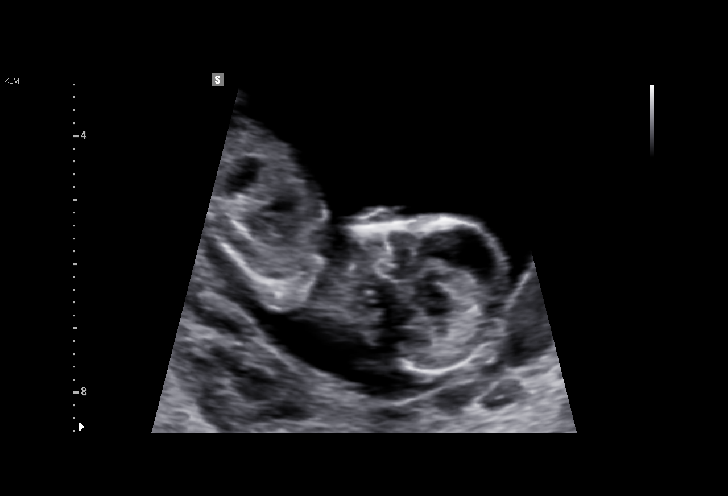
[im 26/28]
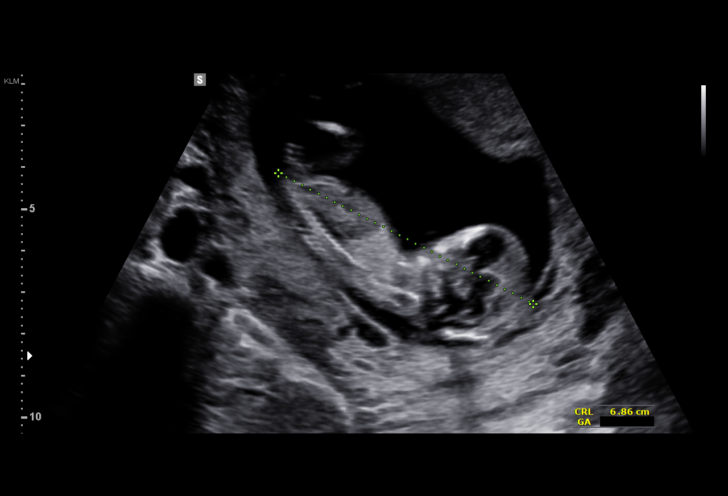
[im 28/28]
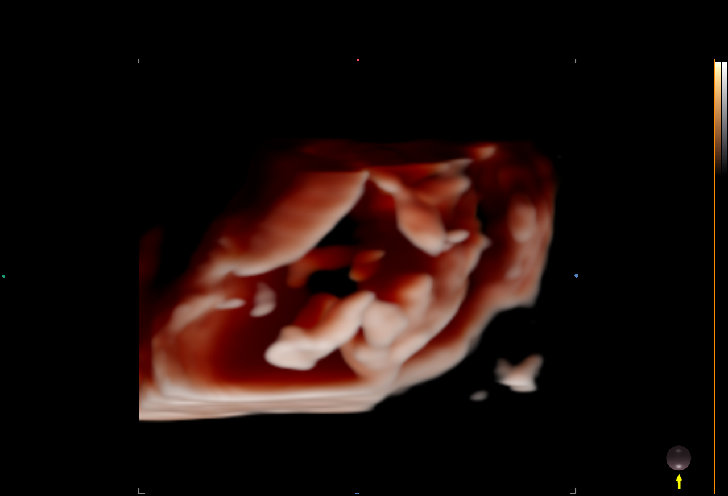

[15 of 28 positions shown; findings below may reference images not displayed]

[REDACTED]-
Faculty Physician

TRANSLUCENCY

1  MOHITA ABIA           [id number]      [id number]     [id number]
Indications

First trimester aneuploidy screen (NT)         Z36
12 weeks gestation of pregnancy
OB History

Gravidity:    1         Term:   0        Prem:   0        SAB:   0
TOP:          0       Ectopic:  0        Living: 0
Fetal Evaluation

Num Of Fetuses:     1
Fetal Heart         161
Rate(bpm):
Cardiac Activity:   Observed
Presentation:       Cephalic
Placenta:           Anterior, above cervical os
P. Cord Insertion:  Visualized

Amniotic Fluid
AFI FV:      Subjectively within normal limits
Gestational Age

LMP:           12w 3d       Date:   12/27/15                 EDD:   10/02/16
Best:          12w 3d    Det. By:   LMP  (12/27/15)          EDD:   10/02/16
1st Trimester Genetic Sonogram Screening

CRL:            68.6  mm    G. Age:   13w 0d                 EDD:   09/28/16
Nuc Trans:       1.5  mm
Nasal Bone:                 Present
Anatomy

Cranium:          Appears normal         Bladder:          Appears normal
Stomach:          Appears normal, left   Upper             Visualized
sided                  Extremities:
Abdominal Wall:   Appears nml (cord      Lower             Visualized
insert, abd wall)      Extremities:
Cord Vessels:     Appears normal (3
vessel cord)
Cervix Uterus Adnexa

Cervix
Closed

Uterus
No abnormality visualized.

Adnexa:       No abnormality visualized.
Impression

Single IUP at 12w 3d
Normal NT (1.5 mm)
Nasal bone visualized.
First trimester aneuploidy screen performed as noted above.
Please do not draw triple/quad screen, though patient should
be offered MSAFP for neural tube defect screening.
Recommendations

Recommend ultrasound for fetal anatomy at 18-20 weeks

## 2017-06-07 ENCOUNTER — Ambulatory Visit (INDEPENDENT_AMBULATORY_CARE_PROVIDER_SITE_OTHER): Payer: No Typology Code available for payment source | Admitting: Advanced Practice Midwife

## 2017-06-07 ENCOUNTER — Encounter: Payer: Self-pay | Admitting: Advanced Practice Midwife

## 2017-06-07 ENCOUNTER — Encounter: Payer: Self-pay | Admitting: Sports Medicine

## 2017-06-07 ENCOUNTER — Ambulatory Visit (INDEPENDENT_AMBULATORY_CARE_PROVIDER_SITE_OTHER): Payer: No Typology Code available for payment source | Admitting: Sports Medicine

## 2017-06-07 VITALS — BP 104/70 | HR 100 | Temp 99.9°F | Wt 112.0 lb

## 2017-06-07 DIAGNOSIS — R509 Fever, unspecified: Secondary | ICD-10-CM

## 2017-06-07 DIAGNOSIS — B349 Viral infection, unspecified: Secondary | ICD-10-CM

## 2017-06-07 DIAGNOSIS — N61 Mastitis without abscess: Secondary | ICD-10-CM | POA: Diagnosis not present

## 2017-06-07 LAB — CBC WITH DIFFERENTIAL/PLATELET
Basophils Absolute: 0 {cells}/uL (ref 0–200)
Basophils Relative: 0 %
Eosinophils Absolute: 79 {cells}/uL (ref 15–500)
Eosinophils Relative: 1 %
HCT: 37.6 % (ref 35.0–45.0)
Hemoglobin: 12.4 g/dL (ref 11.7–15.5)
Lymphocytes Relative: 15 %
Lymphs Abs: 1185 cells/uL (ref 850–3900)
MCH: 30.8 pg (ref 27.0–33.0)
MCHC: 33 g/dL (ref 32.0–36.0)
MCV: 93.3 fL (ref 80.0–100.0)
MPV: 10.7 fL (ref 7.5–12.5)
Monocytes Absolute: 1027 {cells}/uL — ABNORMAL HIGH (ref 200–950)
Monocytes Relative: 13 %
Neutro Abs: 5609 cells/uL (ref 1500–7800)
Neutrophils Relative %: 71 %
Platelets: 187 K/uL (ref 140–400)
RBC: 4.03 MIL/uL (ref 3.80–5.10)
RDW: 13.7 % (ref 11.0–15.0)
WBC: 7.9 10*3/uL (ref 3.8–10.8)

## 2017-06-07 LAB — COMPREHENSIVE METABOLIC PANEL
AST: 13 U/L (ref 10–30)
Albumin: 3.9 g/dL (ref 3.6–5.1)
CO2: 26 mmol/L (ref 20–31)
Calcium: 8.7 mg/dL (ref 8.6–10.2)
Chloride: 101 mmol/L (ref 98–110)
Creat: 0.63 mg/dL (ref 0.50–1.10)
Glucose, Bld: 79 mg/dL (ref 65–99)
Sodium: 137 mmol/L (ref 135–146)
Total Bilirubin: 0.4 mg/dL (ref 0.2–1.2)

## 2017-06-07 LAB — COMPREHENSIVE METABOLIC PANEL WITH GFR
ALT: 13 U/L (ref 6–29)
Alkaline Phosphatase: 76 U/L (ref 33–115)
BUN: 11 mg/dL (ref 7–25)
Potassium: 4 mmol/L (ref 3.5–5.3)
Total Protein: 6.3 g/dL (ref 6.1–8.1)

## 2017-06-07 NOTE — Progress Notes (Signed)
Subjective:     Patient ID: Dana Eaton, female   DOB: 1986-05-30, 31 y.o.   MRN: [id number]  Dana Eaton is a 31 y.o. G1P1001 who has an 8 month breastfeeding infant. She reports that she has had fevers off and on since June. She reports that her breasts have been sore as well, and she has concern for mastitis. She reports that it has been harder to pump recently, and there has been a slight decrease in mild supply. She reports that her daughter is in daycare, and she is also wondering if there has been an exposure to an illness there as well. She was seen by her PCP earlier today, and he has ordered labs. She did not feel comfortable with a female doctor performing a breast exam, so she is here for breast exam to look for mastitis as a potential cause of these prolonged fevers.    Fever   This is a new problem. The current episode started 1 to 4 weeks ago. The problem occurs intermittently. The maximum temperature noted was 103 to 103.9 F. The temperature was taken using an oral thermometer. Pertinent negatives include no abdominal pain, diarrhea, nausea, urinary pain or vomiting.     Review of Systems  Constitutional: Positive for chills and fever (Tmax 103.7 on 05/23/17).  Gastrointestinal: Negative for abdominal pain, diarrhea, nausea and vomiting.  Genitourinary: Negative for dysuria, frequency, pelvic pain and urgency.  Musculoskeletal: Positive for myalgias.       Objective:   Physical Exam  Constitutional: She is oriented to person, place, and time. She appears well-developed and well-nourished. No distress.  HENT:  Head: Normocephalic.  Cardiovascular: Normal rate.   Pulmonary/Chest: Effort normal. Right breast exhibits no inverted nipple, no mass, no nipple discharge, no skin change and no tenderness. Left breast exhibits no inverted nipple, no mass, no nipple discharge, no skin change and no tenderness.  Abdominal: Soft. There is no tenderness. There is no rebound.   Neurological: She is alert and oriented to person, place, and time.  Skin: Skin is warm and dry.  Psychiatric: She has a normal mood and affect.  Nursing note and vitals reviewed. Breast exam is normal today. There is a slightly reddened crease at the junction of the nipple and the areola. This is likely from the pump flange not fitting well. No concern for infection/mastitis today.      Assessment:     1. Fever of unknown origin        Plan:     Unlikely this is related to mastitis Mastitis warning signs reviewed     PCP has ordered: CBC with Diff CMET Epstein-Barr IgM and IgG CMV IgM ANA RSV   RX: none today  FU with PCP for results or further concerns, may return here as needed  Thressa ShellerHeather Hogan 11:18 AM 06/07/17

## 2017-06-07 NOTE — Assessment & Plan Note (Signed)
Patient desires only female provider to look at this, she will be seen by OB/GYN today.

## 2017-06-07 NOTE — Assessment & Plan Note (Signed)
Sounds like herpangina, she does have an 3359-month-old that has started daycare. I have explained to her that this was normal with a child in daycare, and that she should simply control the symptoms. Exam is benign today. Ibuprofen 800, Tylenol 3 times a day. Per patient request I am going to check viral serologies and other labs. She does have concurrent mastitis I expect some degree of leukocytosis.

## 2017-06-07 NOTE — Progress Notes (Signed)
  Subjective:    CC: Fevers, breast pain  HPI: This is a pleasant 31 year old female, she is currently breast-feeding, and her daughter has also started daycare. Unfortunately for the past several months she's had intermittent fevers, they have been associated with vomiting and diarrhea in the past, when that resolved she then had fevers up to 103 with sore throat, and shallow ulcers in her mouth. Currently she is developing another fever up to 103 Fahrenheit, with a mild sore throat. She did have some mouth ulcers which have resolved.  She is also complaining of bilateral breast irritation, with redness around the nipples and areolae.  She desires that only female provider examine these.  Past medical history:  Negative.  See flowsheet/record as well for more information.  Surgical history: Negative.  See flowsheet/record as well for more information.  Family history: Negative.  See flowsheet/record as well for more information.  Social history: Negative.  See flowsheet/record as well for more information.  Allergies, and medications have been entered into the medical record, reviewed, and no changes needed.   Review of Systems: No fevers, chills, night sweats, weight loss, chest pain, or shortness of breath.   Objective:    General: Well Developed, well nourished, and in no acute distress.  Neuro: Alert and oriented x3, extra-ocular muscles intact, sensation grossly intact.  HEENT: Normocephalic, atraumatic, pupils equal round reactive to light, neck supple, no masses, no lymphadenopathy, thyroid nonpalpable. Oropharynx, nasopharynx, ear canals unremarkable Skin: Warm and dry, no rashes. Cardiac: Regular rate and rhythm, no murmurs rubs or gallops, no lower extremity edema.  Respiratory: Clear to auscultation bilaterally. Not using accessory muscles, speaking in full sentences.  Impression and Recommendations:    Viral syndrome Sounds like herpangina, she does have an 8625-month-old that  has started daycare. I have explained to her that this was normal with a child in daycare, and that she should simply control the symptoms. Exam is benign today. Ibuprofen 800, Tylenol 3 times a day. Per patient request I am going to check viral serologies and other labs. She does have concurrent mastitis I expect some degree of leukocytosis.  Mastitis Patient desires only female provider to look at this, she will be seen by OB/GYN today.

## 2017-06-10 LAB — ANA: Anti Nuclear Antibody(ANA): NEGATIVE

## 2017-06-10 LAB — EPSTEIN-BARR VIRUS VCA, IGG: EBV VCA IgG: 404 U/mL — ABNORMAL HIGH

## 2017-06-10 LAB — EPSTEIN-BARR VIRUS VCA, IGM: EBV VCA IgM: <36 U/mL

## 2017-06-11 LAB — CMV IGM: CMV IgM: <30 [AU]/ml (ref <30.00)

## 2017-06-13 LAB — RSV(RESPIRATORY SYNCYTIAL VIRUS) AB, BLOOD: RSV Antibodies: 1:16 {titer} — ABNORMAL HIGH

## 2017-07-03 ENCOUNTER — Ambulatory Visit (INDEPENDENT_AMBULATORY_CARE_PROVIDER_SITE_OTHER): Payer: No Typology Code available for payment source | Admitting: Obstetrics & Gynecology

## 2017-07-03 ENCOUNTER — Encounter: Payer: Self-pay | Admitting: Obstetrics & Gynecology

## 2017-07-03 VITALS — BP 97/61 | HR 74 | Resp 16 | Ht 64.0 in | Wt 108.0 lb

## 2017-07-03 DIAGNOSIS — Z01419 Encounter for gynecological examination (general) (routine) without abnormal findings: Secondary | ICD-10-CM

## 2017-07-03 NOTE — Progress Notes (Signed)
Subjective:     Dana Eaton is a 31 y.o. female here for a routine exam.  Current complaints: none.  Just stopped work to stay home with child.    Gynecologic History Patient's last menstrual period was 06/19/2017. Contraception: coitus interruptus Last Pap: 2017. Results were: normal Last mammogram: n/a  Obstetric History OB History  Gravida Para Term Preterm AB Living  1 1 1     1   SAB TAB Ectopic Multiple Live Births        0 1    # Outcome Date GA Lbr Len/2nd Weight Sex Delivery Anes PTL Lv  1 Term 10/04/16 269w2d 10:49 / 00:57 7 lb 12.3 oz (3.524 kg) F Vag-Spont None  LIV       The following portions of the patient's history were reviewed and updated as appropriate: allergies, current medications, past family history, past medical history, past social history, past surgical history and problem list.  Review of Systems Pertinent items noted in HPI and remainder of comprehensive ROS otherwise negative.    Objective:      Vitals:   07/03/17 0835  BP: 97/61  Pulse: 74  Resp: 16  Weight: 108 lb (49 kg)  Height: 5\' 4"  (1.626 m)   Vitals:  WNL General appearance: alert, cooperative and no distress  HEENT: Normocephalic, without obvious abnormality, atraumatic Eyes: negative Throat: lips, mucosa, and tongue normal; teeth and gums normal  Respiratory: Clear to auscultation bilaterally  CV: Regular rate and rhythm  Breasts:  Normal appearance, no masses or tenderness, no nipple retraction or dimpling  GI: Soft, non-tender; bowel sounds normal; no masses,  no organomegaly  GU: External Genitalia:  Tanner V, no lesion Urethra:  No prolapse   Vagina: Pink, normal rugae, no blood or discharge  Cervix: No CMT, no lesion  Uterus:  Normal size and contour, non tender  Adnexa: Normal, no masses, non tender  Musculoskeletal: No edema, redness or tenderness in the calves or thighs  Skin: No lesions or rash  Lymphatic: Axillary adenopathy: none     Psychiatric: Normal  mood and behavior        Assessment:    Healthy female exam.    Plan:    Education reviewed: self breast exams. Contraception: coitus interruptus. Follow up in: 1 year.    Pap due 2020 Continue PNV; Pt and husband do not want to use birth control.  They would be happy with another pregnancy.

## 2017-07-05 ENCOUNTER — Ambulatory Visit: Payer: No Typology Code available for payment source | Admitting: Sports Medicine

## 2018-03-19 ENCOUNTER — Telehealth: Payer: Self-pay | Admitting: Obstetrics & Gynecology

## 2018-03-19 NOTE — Telephone Encounter (Signed)
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  If it is painful and debilitating then surgery may be needed.  She states that she may want more babies so she thinks that is not feasible  right now.   I suggested [facility] and getting a good abd binder or brace and using that especially when exercising.

## 2018-03-19 NOTE — Telephone Encounter (Signed)
Pt called states delivered 17 months ago in Nov 2017. Pt states she has diastis recti with discomfort. Pt says it is separation of abdominal muscles. Please advise what can be done. Call pt [phone].

## 2018-05-02 ENCOUNTER — Ambulatory Visit (INDEPENDENT_AMBULATORY_CARE_PROVIDER_SITE_OTHER): Payer: No Typology Code available for payment source | Admitting: Physician Assistant

## 2018-05-02 ENCOUNTER — Encounter: Payer: Self-pay | Admitting: Physician Assistant

## 2018-05-02 VITALS — BP 126/84 | HR 56 | Temp 98.2°F | Resp 14 | Wt 111.0 lb

## 2018-05-02 DIAGNOSIS — Z7689 Persons encountering health services in other specified circumstances: Secondary | ICD-10-CM | POA: Diagnosis not present

## 2018-05-02 DIAGNOSIS — R1031 Right lower quadrant pain: Secondary | ICD-10-CM

## 2018-05-02 DIAGNOSIS — R1013 Epigastric pain: Secondary | ICD-10-CM

## 2018-05-02 DIAGNOSIS — K319 Disease of stomach and duodenum, unspecified: Secondary | ICD-10-CM | POA: Diagnosis not present

## 2018-05-02 DIAGNOSIS — M6208 Separation of muscle (nontraumatic), other site: Secondary | ICD-10-CM | POA: Diagnosis not present

## 2018-05-02 LAB — CBC WITH DIFFERENTIAL/PLATELET
Basophils Absolute: 52 cells/uL (ref 0–200)
Basophils Relative: 0.9 %
EOS PCT: 3.3 %
Eosinophils Absolute: 191 cells/uL (ref 15–500)
HCT: 36.9 % (ref 35.0–45.0)
Hemoglobin: 12.8 g/dL (ref 11.7–15.5)
LYMPHS ABS: 2262 {cells}/uL (ref 850–3900)
MCH: 32 pg (ref 27.0–33.0)
MCHC: 34.7 g/dL (ref 32.0–36.0)
MCV: 92.3 fL (ref 80.0–100.0)
MPV: 11.9 fL (ref 7.5–12.5)
Monocytes Relative: 7.1 %
Neutro Abs: 2883 cells/uL (ref 1500–7800)
Neutrophils Relative %: 49.7 %
PLATELETS: 225 10*3/uL (ref 140–400)
RBC: 4 10*6/uL (ref 3.80–5.10)
RDW: 11.6 % (ref 11.0–15.0)
Total Lymphocyte: 39 %
WBC: 5.8 10*3/uL (ref 3.8–10.8)
WBCMIX: 412 {cells}/uL (ref 200–950)

## 2018-05-02 LAB — COMPREHENSIVE METABOLIC PANEL
AG Ratio: 2 (calc) (ref 1.0–2.5)
ALKALINE PHOSPHATASE (APISO): 40 U/L (ref 33–115)
ALT: 11 U/L (ref 6–29)
AST: 11 U/L (ref 10–30)
Albumin: 4.4 g/dL (ref 3.6–5.1)
BILIRUBIN TOTAL: 0.5 mg/dL (ref 0.2–1.2)
BUN: 14 mg/dL (ref 7–25)
CALCIUM: 9.3 mg/dL (ref 8.6–10.2)
CO2: 28 mmol/L (ref 20–32)
Chloride: 104 mmol/L (ref 98–110)
Creat: 0.71 mg/dL (ref 0.50–1.10)
Globulin: 2.2 g/dL (calc) (ref 1.9–3.7)
Glucose, Bld: 82 mg/dL (ref 65–139)
Potassium: 4.1 mmol/L (ref 3.5–5.3)
Sodium: 139 mmol/L (ref 135–146)
TOTAL PROTEIN: 6.6 g/dL (ref 6.1–8.1)

## 2018-05-02 LAB — LIPASE: Lipase: 18 U/L (ref 7–60)

## 2018-05-02 MED ORDER — OMEPRAZOLE 20 MG PO CPDR: 20.0000 mg | DELAYED_RELEASE_CAPSULE | Freq: Every day | ORAL | 0 refills | Status: DC

## 2018-05-02 NOTE — Patient Instructions (Addendum)
For digestive symptoms (early satiety, belching, throat tightness) - most likely cause is GERD - recommend following GERD diet and lifestyle recommendations (below) - start Omeprazole 1 capsule daily - avoid NSAID's like Advil, Ibuprofen, Aleve etc.  For diastasis recti / abdominal wall pain - you will be contacted to schedule a CT scan of your abdomen - if there is a hernia or large separation of your abdominal wall, I will refer you to a general surgeon to discuss correction  Food Choices for Gastroesophageal Reflux Disease, Adult When you have gastroesophageal reflux disease (GERD), the foods you eat and your eating habits are very important. Choosing the right foods can help ease the discomfort of GERD. Consider working with a diet and nutrition specialist (dietitian) to help you make healthy food choices. What general guidelines should I follow? Eating plan  Choose healthy foods low in fat, such as fruits, vegetables, whole grains, low-fat dairy products, and lean meat, fish, and poultry.  Eat frequent, small meals instead of three large meals each day. Eat your meals slowly, in a relaxed setting. Avoid bending over or lying down until 2-3 hours after eating.  Limit high-fat foods such as fatty meats or fried foods.  Limit your intake of oils, butter, and shortening to less than 8 teaspoons each day.  Avoid the following: ? Foods that cause symptoms. These may be different for different people. Keep a food diary to keep track of foods that cause symptoms. ? Alcohol. ? Drinking large amounts of liquid with meals. ? Eating meals during the 2-3 hours before bed.  Cook foods using methods other than frying. This may include baking, grilling, or broiling. Lifestyle   Maintain a healthy weight. Ask your health care provider what weight is healthy for you. If you need to lose weight, work with your health care provider to do so safely.  Exercise for at least 30 minutes on 5 or more  days each week, or as told by your health care provider.  Avoid wearing clothes that fit tightly around your waist and chest.  Do not use any products that contain nicotine or tobacco, such as cigarettes and e-cigarettes. If you need help quitting, ask your health care provider.  Sleep with the head of your bed raised. Use a wedge under the mattress or blocks under the bed frame to raise the head of the bed. What foods are not recommended? The items listed may not be a complete list. Talk with your dietitian about what dietary choices are best for you. Grains Pastries or quick breads with added fat. [place] toast. Vegetables Deep fried vegetables. [place] fries. Any vegetables prepared with added fat. Any vegetables that cause symptoms. For some people this may include tomatoes and tomato products, chili peppers, onions and garlic, and horseradish. Fruits Any fruits prepared with added fat. Any fruits that cause symptoms. For some people this may include citrus fruits, such as oranges, grapefruit, pineapple, and lemons. Meats and other protein foods High-fat meats, such as fatty beef or pork, hot dogs, ribs, ham, sausage, salami and bacon. Fried meat or protein, including fried fish and fried chicken. Nuts and nut butters. Dairy Whole milk and chocolate milk. Sour cream. Cream. Ice cream. Cream cheese. Milk shakes. Beverages Coffee and tea, with or without caffeine. Carbonated beverages. Sodas. Energy drinks. Fruit juice made with acidic fruits (such as orange or grapefruit). Tomato juice. Alcoholic drinks. Fats and oils Butter. Margarine. Shortening. Ghee. Sweets and desserts Chocolate and cocoa. Donuts. Seasoning and other  foods Pepper. Peppermint and spearmint. Any condiments, herbs, or seasonings that cause symptoms. For some people, this may include curry, hot sauce, or vinegar-based salad dressings. Summary  When you have gastroesophageal reflux disease (GERD), food and lifestyle  choices are very important to help ease the discomfort of GERD.  Eat frequent, small meals instead of three large meals each day. Eat your meals slowly, in a relaxed setting. Avoid bending over or lying down until 2-3 hours after eating.  Limit high-fat foods such as fatty meat or fried foods. This information is not intended to replace advice given to you by your health care provider. Make sure you discuss any questions you have with your health care provider. Document Released: 11/19/2005 Document Revised: 11/20/2016 Document Reviewed: 11/20/2016 Elsevier Interactive Patient Education  2018 [employer]   Diastasis Recti Diastasis recti is when the muscles of the abdomen (rectus abdominis muscles) become thin and separate. The result is a wider space between the right and left abdomen (abdominal) muscles. This wider space between the muscles may cause a bulge in the middle of your abdomen. You may notice this bulge when you are straining or when you sit up from a lying down position. Diastasis recti can affect men and women. It is most common among pregnant women, infants, people who are obese, and people who have had abdominal surgery. Exercise or surgical treatment may help correct it. What are the causes? Common causes of this condition include:  Pregnancy. The growing uterus puts pressure on the abdominal muscles, which causes the muscles to separate.  Obesity. Excess fat puts pressure on abdominal muscles.  Weightlifting.  Some abdomen exercises.  Advanced age.  Genetics.  Prior abdominal surgery.  What increases the risk? This condition is more likely to develop in:  Women.  Newborns, especially newborns who are born early (prematurely).  What are the signs or symptoms? Common symptoms of this condition include:  A bulge in the middle of the abdomen. You will notice it most when you sit up or strain.  Pain in the low back, pelvis, or  hips.  Constipation.  Inability to control when you urinate (urinary incontinence).  Bloating.  Poor posture.  How is this diagnosed? This condition is diagnosed with a physical exam. Your health care provider will ask you to lie flat on your back and do a crunch or half sit-up. If you have diastasis recti, a vertical bulge will appear between your abdominal muscles in the center of your abdomen. Your health care provider will measure the gap between your muscles with one of the following:  A medical device used to measure the space between two objects (caliper).  A tape measure.  CT scan.  Ultrasound.  Finger spaces. Your health care provider will measure the space using their fingers.  How is this treated? If your muscle separation is not too large, you may not need treatment. However, if you are a woman who plans to become pregnant again, you should treat this condition before your next pregnancy. Treatment may include:  Physical therapy to strengthen and tighten your abdominal muscles.  Lifestyle changes such as weight loss and exercise.  Over-the-counter pain medicines as needed.  Surgery to correct the separation.  Follow these instructions at home: Activity  Return to your normal activities as told by your health care provider. Ask your health care provider what activities are safe for you.  When lifting weights or doing exercises using your abdominal muscles or the muscles in the  center of your body that give stability (core muscles), make sure you are doing your exercises and movements correctly. Proper form can help to prevent the condition from happening again. General instructions  If you are overweight, ask your health care provider for help with weight loss. Losing even a small amount of weight can help to improve your diastasis recti.  Take over-the-counter or prescription medicines only as told by your health care provider.  Do not strain. Straining can  make the separation worse. Examples of straining include: ? Pushing hard to have a bowel movement, such as due to constipation. ? Lifting heavy objects, including children. ? Standing up and sitting down.  Take steps to prevent constipation: ? Drink enough fluid to keep your urine clear or pale yellow. ? Take over-the-counter or prescription medicines only as directed. ? Eat foods that are high in fiber, such as fresh fruits and vegetables, whole grains, and beans. ? Limit foods that are high in fat and processed sugars, such as fried and sweet foods. Contact a health care provider if:  You notice a new bulge in your abdomen. Get help right away if:  You experience severe discomfort in your abdomen.  You develop severe abdominal pain along with nausea, vomiting, or fever. Summary  Diastasis recti is when the abdomen (abdominal) muscles become thin and separate. Your abdomen will stick out because the space between your right and left abdomen muscles has widened.  The most common symptom is a bulge in your abdomen. You will notice it most when you sit up or are straining.  This condition is diagnosed during a physical exam.  If the abdomen separation is not too big, you may choose not to have treatment. Otherwise, you may need to undergo physical therapy or surgery. This information is not intended to replace advice given to you by your health care provider. Make sure you discuss any questions you have with your health care provider. Document Released: 01/14/2017 Document Revised: 01/14/2017 Document Reviewed: 01/14/2017 Elsevier Interactive Patient Education  [employer].

## 2018-05-02 NOTE — Progress Notes (Signed)
HPI:                                                                Dana Eaton is a 32 y.o. female [facility] today to establish care  Switching care from another provider in the office because she prefers a female PCP  Current concerns:    Abdominal wall pain: reports diasthesis recti due to pregnancy 2 years ago. She has tried abdominal wall strengthening exercises/PT, but reports little improvement. There is now a new bulge in her RLQ whenever she contracts her abdominal wall (climbing stairs, lifting). She describes the bulge as "discomfort," but denies any tenderness or exquisite pain. Denies any change in bowel habits.   She also c/o early satiety, belching, and throat tightness for the last 2 years. Symptoms are waxing and waning. Denies fever, unintended weight loss, forceful vomiting, hematemesis, melena or hematochezia. Has not tried any treatments. Reports she drinks coffee regularly.  [facility]  Decreased Interest 0  Down, Depressed, Hopeless 0  PHQ - 2 Score 0    No flowsheet data found.    Past Medical History:  Diagnosis Date  . Anemia    Past Surgical History:  Procedure Laterality Date  . MANDIBLE SURGERY     Social History   Tobacco Use  . Smoking status: Never Smoker  . Smokeless tobacco: Never Used  Substance Use Topics  . Alcohol use: No    Alcohol/week: 0.0 oz   family history includes Osteoarthritis in her maternal grandmother; Osteoporosis in her maternal grandmother; Pancreatic cancer (age of onset: 3176) in her maternal grandmother; Seizures in her father.    ROS: negative except as noted in the HPI  Medications: Current Outpatient Medications  Medication Sig Dispense Refill  . omeprazole (PRILOSEC) 20 MG capsule Take 1 capsule (20 mg total) by mouth daily. 30 capsule 0   No current facility-administered medications for this visit.    No  Known Allergies     Objective:  BP 126/84   Pulse (!) 56   Temp 98.2 F (36.8 C) (Oral)   Resp 14   Wt 111 lb (50.3 kg)   Breastfeeding? No   BMI 19.05 kg/m  Gen:  alert, not ill-appearing, no distress, appropriate for age HEENT: head normocephalic without obvious abnormality, conjunctiva and cornea clear, trachea midline Pulm: Normal work of breathing, normal phonation, clear to auscultation bilaterally, no wheezes, rales or rhonchi CV: Normal rate, regular rhythm, s1 and s2 distinct, no murmurs, clicks or rubs  GI: abdomen with approx 1.5 cm separation of the rectus abdominus muscles to the umbilicus with valsava, no tenderness, no palpable masses Neuro: alert and oriented x 3, no tremor MSK: extremities atraumatic, normal gait and station Skin: intact, no rashes on exposed skin, no jaundice, no cyanosis Psych: well-groomed, cooperative, good eye contact, euthymic mood, affect mood-congruent, speech is articulate, and thought processes clear and goal-directed    No results found for this or any previous visit (from the past 72 hour(s)). No results found.    Assessment and Plan: 32 y.o. female with   Encounter to establish care  Diastasis recti - Plan: CT Abdomen Pelvis W Contrast  Dyspepsia and disorder of function of  stomach - Plan: CBC with Differential/Platelet, Comprehensive metabolic panel, Lipase, omeprazole (PRILOSEC) 20 MG capsule, H. pylori breath test  Right lower quadrant abdominal pain - Plan: CT Abdomen Pelvis W Contrast  - Personally reviewed PMH, PSH, PFH, medications, allergies, HM - Age-appropriate cancer screening: Pap smear UTD - Influenza n/a - Tdap UTD - PHQ2 negative  Dyspepsia - suspect underlying GERD. Differential also includes PUD, gastritis, esophagitis/Barret's. No red flag symptoms. Patient expressed concern that she wants a firm diagnosis. She is also concerned about cost. Since she has no red flag symptoms, recommend we check basic  labs to r/o anemia from occult blood loss, H. Pylori, liver and pancreatic enzymes. Trial low-dose PPI for 2-4 weeks, GERD diet and assess response.  Abdominal wall pain - CT Abd/Pelvis ordered to assess for umbilical or ventral hernia. If her diastasis recti requires surgical correction, CT will also guide pre-op plan.   Patient education and anticipatory guidance given Patient agrees with treatment plan Follow-up as needed if symptoms worsen or fail to improve  Levonne Hubert PA-C

## 2018-05-05 LAB — H. PYLORI BREATH TEST: H. pylori Breath Test: NOT DETECTED

## 2018-05-05 NOTE — Progress Notes (Signed)
Good afternoon,  Labs look great. H. Pylori test was negative. No evidence of anemia. Treatment plan does not change. Continue trial of Omeprazole daily and follow-up in 2-3 weeks.  Best, Vinetta Bergamoharley

## 2018-05-06 ENCOUNTER — Encounter: Payer: Self-pay | Admitting: Physician Assistant

## 2018-05-26 ENCOUNTER — Ambulatory Visit: Payer: No Typology Code available for payment source | Admitting: Physician Assistant

## 2018-06-01 ENCOUNTER — Other Ambulatory Visit: Payer: Self-pay | Admitting: Physician Assistant

## 2018-06-01 DIAGNOSIS — K319 Disease of stomach and duodenum, unspecified: Secondary | ICD-10-CM

## 2018-06-01 DIAGNOSIS — R1013 Epigastric pain: Principal | ICD-10-CM

## 2018-12-11 ENCOUNTER — Ambulatory Visit (INDEPENDENT_AMBULATORY_CARE_PROVIDER_SITE_OTHER): Payer: No Typology Code available for payment source | Admitting: Obstetrics & Gynecology

## 2018-12-11 ENCOUNTER — Encounter: Payer: Self-pay | Admitting: Obstetrics & Gynecology

## 2018-12-11 VITALS — BP 96/64 | HR 85 | Ht 64.0 in | Wt 113.0 lb

## 2018-12-11 DIAGNOSIS — Z124 Encounter for screening for malignant neoplasm of cervix: Secondary | ICD-10-CM

## 2018-12-11 DIAGNOSIS — Z1151 Encounter for screening for human papillomavirus (HPV): Secondary | ICD-10-CM | POA: Diagnosis not present

## 2018-12-11 DIAGNOSIS — N941 Unspecified dyspareunia: Secondary | ICD-10-CM

## 2018-12-11 DIAGNOSIS — Z01419 Encounter for gynecological examination (general) (routine) without abnormal findings: Secondary | ICD-10-CM | POA: Diagnosis not present

## 2018-12-11 DIAGNOSIS — R1031 Right lower quadrant pain: Secondary | ICD-10-CM | POA: Diagnosis not present

## 2018-12-11 NOTE — Progress Notes (Signed)
PT c/o painful intercourse

## 2018-12-11 NOTE — Progress Notes (Signed)
Subjective:    Dana Eaton is a 33 y.o. married P67 (2 yo daughter) female who presents for an annual exam.  She has had a RLQ pain for about 5 years. The only time she gets this pain is with sex or right after sex. She has not tried any meds for this issue. It will last about 2-3 days. It's not bad enough to take a medication. She thinks that it is causing other issues including "vertigo" and ":feeling faint." She is having fatigue. The patient is sexually active. GYN screening history: last pap: was normal. The patient wears seatbelts: no. The patient participates in regular exercise: no. Has the patient ever been transfused or tattooed?: yes. The patient reports that there is not domestic violence in her life.   Menstrual History: OB History    Gravida  1   Para  1   Term  1   Preterm      AB      Living  1     SAB      TAB      Ectopic      Multiple  0   Live Births  1           Menarche age: "doesn't remember" Patient's last menstrual period was 11/10/2018.    The following portions of the patient's history were reviewed and updated as appropriate: allergies, current medications, past family history, past medical history, past social history, past surgical history and problem list.  Review of Systems Pertinent items are noted in HPI.    Objective:    BP 96/64   Pulse 85   Ht 5\' 4"  (1.626 m)   Wt 51.3 kg   LMP 11/10/2018   BMI 19.40 kg/m   General Appearance:    Alert, cooperative, no distress, appears stated age  Head:    Normocephalic, without obvious abnormality, atraumatic  Eyes:    PERRL, conjunctiva/corneas clear, EOM's intact, fundi    benign, both eyes  Ears:    Normal TM's and external ear canals, both ears  Nose:   Nares normal, septum midline, mucosa normal, no drainage    or sinus tenderness  Throat:   Lips, mucosa, and tongue normal; teeth and gums normal  Neck:   Supple, symmetrical, trachea midline, no adenopathy;    thyroid:  no  enlargement/tenderness/nodules; no carotid   bruit or JVD  Back:     Symmetric, no curvature, ROM normal, no CVA tenderness  Lungs:     Clear to auscultation bilaterally, respirations unlabored  Chest Wall:    No tenderness or deformity   Heart:    Regular rate and rhythm, S1 and S2 normal, no murmur, rub   or gallop  Breast Exam:    No tenderness, masses, or nipple abnormality  Abdomen:     Soft, non-tender, bowel sounds active all four quadrants,    no masses, no organomegaly  Genitalia:    Normal female without lesion, discharge or tenderness, normal size and shape, anteverted, mobile, non-tender, normal adnexal exam      Extremities:   Extremities normal, atraumatic, no cyanosis or edema  Pulses:   2+ and symmetric all extremities  Skin:   Skin color, texture, turgor normal, no rashes or lesions  Lymph nodes:   Cervical, supraclavicular, and axillary nodes normal  Neurologic:   CNII-XII intact, normal strength, sensation and reflexes    throughout  .    Assessment:    Healthy female exam.   Pain  with sex   Plan:     Thin prep Pap smear. with cotesting Gyn u/s ordered Come back in 4 weeks With regard to the fatigue and vertigo and feeling faint, I would recommend that she see her primary care provider.

## 2018-12-12 ENCOUNTER — Ambulatory Visit (INDEPENDENT_AMBULATORY_CARE_PROVIDER_SITE_OTHER): Payer: No Typology Code available for payment source

## 2018-12-12 DIAGNOSIS — R1031 Right lower quadrant pain: Secondary | ICD-10-CM | POA: Diagnosis not present

## 2018-12-12 DIAGNOSIS — N941 Unspecified dyspareunia: Secondary | ICD-10-CM | POA: Diagnosis not present

## 2018-12-16 LAB — CYTOLOGY - PAP
DIAGNOSIS: NEGATIVE
HPV (WINDOPATH): NOT DETECTED

## 2019-01-05 ENCOUNTER — Ambulatory Visit: Payer: No Typology Code available for payment source | Admitting: Obstetrics & Gynecology

## 2019-01-05 DIAGNOSIS — Z09 Encounter for follow-up examination after completed treatment for conditions other than malignant neoplasm: Secondary | ICD-10-CM

## 2019-06-17 ENCOUNTER — Telehealth: Payer: Self-pay

## 2019-06-17 ENCOUNTER — Encounter: Payer: Self-pay | Admitting: Family Medicine

## 2019-06-17 ENCOUNTER — Telehealth (INDEPENDENT_AMBULATORY_CARE_PROVIDER_SITE_OTHER): Payer: No Typology Code available for payment source | Admitting: Family Medicine

## 2019-06-17 VITALS — Temp 98.9°F | Ht 64.0 in | Wt 113.0 lb

## 2019-06-17 DIAGNOSIS — R21 Rash and other nonspecific skin eruption: Secondary | ICD-10-CM

## 2019-06-17 DIAGNOSIS — L237 Allergic contact dermatitis due to plants, except food: Secondary | ICD-10-CM

## 2019-06-17 MED ORDER — CLINDAMYCIN HCL 300 MG PO CAPS: 300.0000 mg | ORAL_CAPSULE | Freq: Three times a day (TID) | ORAL | 0 refills | Status: DC

## 2019-06-17 MED ORDER — PREDNISONE 5 MG (48) PO TBPK: ORAL_TABLET | ORAL | 0 refills | Status: DC

## 2019-06-17 MED ORDER — TRIAMCINOLONE ACETONIDE 0.5 % EX CREA: 1.0000 "application " | TOPICAL_CREAM | Freq: Two times a day (BID) | CUTANEOUS | 3 refills | Status: DC

## 2019-06-17 NOTE — Telephone Encounter (Signed)
Pt scheduled  

## 2019-06-17 NOTE — Telephone Encounter (Signed)
Please call patient to schedule a virtual visit.   Dana Eaton called and left a message stating she has had a rash for 2 weeks. She believes it is poison oak or ivy.

## 2019-06-17 NOTE — Progress Notes (Signed)
Virtual Visit  via Video Note  I connected with      Dana Eaton Winthrop by a video enabled telemedicine application and verified that I am speaking with the correct person using two identifiers.   I discussed the limitations of evaluation and management by telemedicine and the availability of in person appointments. The patient expressed understanding and agreed to proceed.  History of Present Illness: Dana Eaton is a 33 y.o. female who would like to discuss rash starting about 2 weeks ago.  Patient notes a pruritic rash starting on her left wrist extending to her right upper extremity and to her leg.  She thinks she may have been exposed to poison ivy retrieving a ball from some scrub.  Her husband recently had poison ivy as well.  She is tried treatment with calamine lotion and other over-the-counter anti-itch medications which helped only a little.  She is not used any topical steroid creams.  She feels pretty well otherwise with no fevers chills nausea vomiting or diarrhea.     Observations/Objective: Temp 98.9 F (37.2 C) (Oral)   Ht 5\' 4"  (1.626 m)   Wt 113 lb (51.3 kg)   BMI 19.40 kg/m  Wt Readings from Last 5 Encounters:  06/17/19 113 lb (51.3 kg)  12/11/18 113 lb (51.3 kg)  05/02/18 111 lb (50.3 kg)  07/03/17 108 lb (49 kg)  06/07/17 112 lb (50.8 kg)   Exam: Appearance Normal Speech.  Skin: Erythematous slightly scaly small patch on left ulnar wrist and larger more macular erythematous rash on right upper arm.   Lab and Radiology Results No results found for this or any previous visit (from the past 72 hour(s)). No results found.   Assessment and Plan: 33 y.o. female with pruritic rash likely poison ivy dermatitis.  Trial of triamcinolone cream as below.  The appearance of the rash in the left ulnar wrist is somewhat scaly.  Is possible that she has early secondary bacterial infection.  However she denies significant pain.  I think it is reasonable to try  treating the rash with triamcinolone however I have prescribed backup clindamycin for use if the rash becomes more painful or starts oozing.     Additionally I have prescribed backup oral prednisone to take if the rash is not improving at all is that should help quite a bit.  I have also asked the patient to take some high-quality pictures and send them to me through my chart is I may be able to get up better look at the rash with high-quality pictures versus video conferencing software.  PDMP not reviewed this encounter. No orders of the defined types were placed in this encounter.  Meds ordered this encounter  Medications  . triamcinolone cream (KENALOG) 0.5 %    Sig: Apply 1 application topically 2 (two) times daily. To affected areas.    Dispense:  60 g    Refill:  3  . predniSONE (STERAPRED UNI-PAK 48 TAB) 5 MG (48) TBPK tablet    Sig: 12 day dosepack po. Back up medicine if rash does not improve with cream.    Dispense:  48 tablet    Refill:  0  . clindamycin (CLEOCIN) 300 MG capsule    Sig: Take 1 capsule (300 mg total) by mouth 3 (three) times daily. Back up antibiotic if rash becomes infected and painful.    Dispense:  21 capsule    Refill:  0    Follow Up Instructions:  I discussed the assessment and treatment plan with the patient. The patient was provided an opportunity to ask questions and all were answered. The patient agreed with the plan and demonstrated an understanding of the instructions.   The patient was advised to call back or seek an in-person evaluation if the symptoms worsen or if the condition fails to improve as anticipated.  Time: 15 minutes of intraservice time, with >22 minutes of total time during today's visit.      Historical information moved to improve visibility of documentation.  Past Medical History:  Diagnosis Date  . Anemia    Past Surgical History:  Procedure Laterality Date  . MANDIBLE SURGERY     Social History   Tobacco  Use  . Smoking status: Never Smoker  . Smokeless tobacco: Never Used  Substance Use Topics  . Alcohol use: No    Alcohol/week: 0.0 standard drinks   family history includes Osteoarthritis in her maternal grandmother; Osteoporosis in her maternal grandmother; Pancreatic cancer (age of onset: 10) in her maternal grandmother; Seizures in her father.  Medications: Current Outpatient Medications  Medication Sig Dispense Refill  . clindamycin (CLEOCIN) 300 MG capsule Take 1 capsule (300 mg total) by mouth 3 (three) times daily. Back up antibiotic if rash becomes infected and painful. 21 capsule 0  . predniSONE (STERAPRED UNI-PAK 48 TAB) 5 MG (48) TBPK tablet 12 day dosepack po. Back up medicine if rash does not improve with cream. 48 tablet 0  . triamcinolone cream (KENALOG) 0.5 % Apply 1 application topically 2 (two) times daily. To affected areas. 60 g 3   No current facility-administered medications for this visit.    No Known Allergies

## 2019-06-17 NOTE — Progress Notes (Signed)
Rash started 2 weeks ago. OTC products not working.

## 2019-06-17 NOTE — Patient Instructions (Signed)
Thank you for coming in today. Apply the triamcinolone cream to the rash 2-3x daily as needed If not better fill and take the prednisone.  If the rash gets worse and more painful and infected fill and take the clindamycin antibiotic.   Keep me updated.   Ok to take benadryl 25-50 (1-2 pills) at bedtime as needed for itching.    Poison Ivy Dermatitis Poison ivy dermatitis is inflammation of the skin that is caused by chemicals in the leaves of the poison ivy plant. The skin reaction often involves redness, swelling, blisters, and extreme itching. What are the causes? This condition is caused by a chemical (urushiol) found in the sap of the poison ivy plant. This chemical is sticky and can be easily spread to people, animals, and objects. You can get poison ivy dermatitis by:  Having direct contact with a poison ivy plant.  Touching animals, other people, or objects that have come in contact with poison ivy and have the chemical on them. What increases the risk? This condition is more likely to develop in people who:  Are outdoors often in wooded or [place] areas.  Go outdoors without wearing protective clothing, such as closed shoes, long pants, and a long-sleeved shirt. What are the signs or symptoms? Symptoms of this condition include:  Redness of the skin.  Extreme itching.  A rash that often includes bumps and blisters. The rash usually appears 48 hours after exposure, if you have been exposed before. If this is the first time you have been exposed, the rash may not appear until a week after exposure.  Swelling. This may occur if the reaction is more severe. Symptoms usually last for 1-2 weeks. However, the first time you develop this condition, symptoms may last 3-4 weeks. How is this diagnosed? This condition may be diagnosed based on your symptoms and a physical exam. Your health care provider may also ask you about any recent outdoor activity. How is this treated?  Treatment for this condition will vary depending on how severe it is. Treatment may include:  Hydrocortisone cream or calamine lotion to relieve itching.  Oatmeal baths to soothe the skin.  Medicines, such as over-the-counter antihistamine tablets.  Oral steroid medicine, for more severe reactions. Follow these instructions at home: Medicines  Take or apply over-the-counter and prescription medicines only as told by your health care provider.  Use hydrocortisone cream or calamine lotion as needed to soothe the skin and relieve itching. General instructions  Do not scratch or rub your skin.  Apply a cold, wet cloth (cold compress) to the affected areas or take baths in cool water. This will help with itching. Avoid hot baths and showers.  Take oatmeal baths as needed. Use colloidal oatmeal. You can get this at your local pharmacy or grocery store. Follow the instructions on the packaging.  While you have the rash, wash clothes right after you wear them.  Keep all follow-up visits as told by your health care provider. This is important. How is this prevented?   Learn to identify the poison ivy plant and avoid contact with the plant. This plant can be recognized by the number of leaves. Generally, poison ivy has three leaves with flowering branches on a single stem. The leaves are typically glossy, and they have jagged edges that come to a point at the front.  If you have been exposed to poison ivy, thoroughly wash with soap and water right away. You have about 30 minutes to remove the  plant resin before it will cause the rash. Be sure to wash under your fingernails, because any plant resin there will continue to spread the rash.  When hiking or camping, wear clothes that will help you to avoid exposure on the skin. This includes long pants, a long-sleeved shirt, tall socks, and hiking boots. You can also apply preventive lotion to your skin to help limit exposure.  If you suspect  that your clothes or outdoor gear came in contact with poison ivy, rinse them off outside with a garden hose before you bring them inside your house.  When doing yard work or gardening, wear gloves, long sleeves, long pants, and boots. Wash your garden tools and gloves if they come in contact with poison ivy.  If you suspect that your pet has come into contact with poison ivy, wash him or her with pet shampoo and water. Make sure to wear gloves while washing your pet. Contact a health care provider if you have:  Open sores in the rash area.  More redness, swelling, or pain in the affected area.  Redness that spreads beyond the rash area.  Fluid, blood, or pus coming from the affected area.  A fever.  A rash over a large area of your body.  A rash on your eyes, mouth, or genitals.  A rash that does not improve after a few weeks. Get help right away if:  Your face swells or your eyes swell shut.  You have trouble breathing.  You have trouble swallowing. These symptoms may represent a serious problem that is an emergency. Do not wait to see if the symptoms will go away. Get medical help right away. Call your local emergency services (911 in the [place] Do not drive yourself to the hospital. Summary  Poison ivy dermatitis is inflammation of the skin that is caused by chemicals in the leaves of the poison ivy plant.  Symptoms of this condition include redness, itching, a rash, and swelling.  Do not scratch or rub your skin.  Take or apply over-the-counter and prescription medicines only as told by your health care provider. This information is not intended to replace advice given to you by your health care provider. Make sure you discuss any questions you have with your health care provider. Document Released: 11/16/2000 Document Revised: 03/13/2019 Document Reviewed: 11/14/2018 Elsevier Patient Education  2020 [employer].

## 2019-06-26 ENCOUNTER — Other Ambulatory Visit: Payer: Self-pay

## 2019-06-26 ENCOUNTER — Ambulatory Visit (INDEPENDENT_AMBULATORY_CARE_PROVIDER_SITE_OTHER): Payer: No Typology Code available for payment source | Admitting: Family Medicine

## 2019-06-26 ENCOUNTER — Encounter: Payer: Self-pay | Admitting: Family Medicine

## 2019-06-26 VITALS — BP 106/64 | HR 64 | Ht 64.0 in | Wt 114.0 lb

## 2019-06-26 DIAGNOSIS — R110 Nausea: Secondary | ICD-10-CM

## 2019-06-26 DIAGNOSIS — R1013 Epigastric pain: Secondary | ICD-10-CM

## 2019-06-26 MED ORDER — LANSOPRAZOLE 30 MG PO CPDR: 30.0000 mg | DELAYED_RELEASE_CAPSULE | Freq: Every day | ORAL | 1 refills | Status: DC

## 2019-06-26 NOTE — Progress Notes (Signed)
Acute Office Visit  Subjective:    Patient ID: Dana Eaton, female    DOB: 27-Mar-1986, 33 y.o.   MRN: [id number]  Chief Complaint  Patient presents with  . Abdominal Pain      HPI Patient is in today for Pt reports that she has been experiencing pain at the end of her sternum. This has been on and off for years.  She says most of the time when it bothers her she describes it as a dull ache and she will often experience nausea with it.  She says it usually happens at least once a day but she is most concerned because about 2 nights ago she had sharp severe shooting pain in the epigastric area to the point where she had to lay down on the floor and thought she was going to vomit but never did.  She sometimes feels feel hot when it happens. Denies f/s/c/v/d. Last BM was yesterday and it was normal.  Denies any blood in the stool or problems with constipation or diarrhea.  She took some TUMs and that helped some.  She does drink coffee daily.  And says this morning noticed some more discomfort after drinking her coffee.  Though she does not typically feel that the pain is worse or better with eating or without eating.  She does not necessarily feel that food is a trigger.  She had negative H Pylori testing about a year ago.    Past Medical History:  Diagnosis Date  . Anemia     Past Surgical History:  Procedure Laterality Date  . MANDIBLE SURGERY      Family History  Problem Relation Age of Onset  . Osteoarthritis Maternal Grandmother   . Osteoporosis Maternal Grandmother   . Pancreatic cancer Maternal Grandmother [id number]  . Seizures Father     Social History   Socioeconomic History  . Marital status: Married    Spouse name: Not on file  . Number of children: Not on file  . Years of education: Not on file  . Highest education level: Not on file  Occupational History  . Not on file  Social Needs  . Financial resource strain: Not on file  . Food insecurity    Worry: Not on  file    Inability: Not on file  . Transportation needs    Medical: Not on file    Non-medical: Not on file  Tobacco Use  . Smoking status: Never Smoker  . Smokeless tobacco: Never Used  Substance and Sexual Activity  . Alcohol use: No    Alcohol/week: 0.0 standard drinks  . Drug use: No  . Sexual activity: Yes    Partners: Male    Birth control/protection: None  Lifestyle  . Physical activity    Days per week: Not on file    Minutes per session: Not on file  . Stress: Not on file  Relationships  . Social Musicianconnections    Talks on phone: Not on file    Gets together: Not on file    Attends religious service: Not on file    Active member of club or organization: Not on file    Attends meetings of clubs or organizations: Not on file    Relationship status: Not on file  . Intimate partner violence    Fear of current or ex partner: Not on file    Emotionally abused: Not on file    Physically abused: Not on file    Forced sexual  activity: Not on file  Other Topics Concern  . Not on file  Social History Narrative  . Not on file    Outpatient Medications Prior to Visit  Medication Sig Dispense Refill  . clindamycin (CLEOCIN) 300 MG capsule Take 1 capsule (300 mg total) by mouth 3 (three) times daily. Back up antibiotic if rash becomes infected and painful. 21 capsule 0  . predniSONE (STERAPRED UNI-PAK 48 TAB) 5 MG (48) TBPK tablet 12 day dosepack po. Back up medicine if rash does not improve with cream. 48 tablet 0  . triamcinolone cream (KENALOG) 0.5 % Apply 1 application topically 2 (two) times daily. To affected areas. 60 g 3   No facility-administered medications prior to visit.     No Known Allergies  ROS     Objective:    Physical Exam  Constitutional: She is oriented to person, place, and time. She appears well-developed and well-nourished.  HENT:  Head: Normocephalic and atraumatic.  Cardiovascular: Normal rate, regular rhythm and normal heart sounds.   Pulmonary/Chest: Effort normal and breath sounds normal.  Abdominal: Soft. Bowel sounds are normal. She exhibits no distension and no mass. There is no abdominal tenderness. There is no rebound and no guarding.  Neurological: She is alert and oriented to person, place, and time.  Skin: Skin is warm and dry.  Psychiatric: She has a normal mood and affect. Her behavior is normal.    BP 106/64   Pulse 64   Ht 5\' 4"  (1.626 m)   Wt 114 lb (51.7 kg)   SpO2 98%   BMI 19.57 kg/m  Wt Readings from Last 3 Encounters:  06/26/19 114 lb (51.7 kg)  06/17/19 113 lb (51.3 kg)  12/11/18 113 lb (51.3 kg)    There are no preventive care reminders to display for this patient.  There are no preventive care reminders to display for this patient.   Lab Results  Component Value Date   TSH 1.641 12/02/2014   Lab Results  Component Value Date   WBC 5.8 05/02/2018   HGB 12.8 05/02/2018   HCT 36.9 05/02/2018   MCV 92.3 05/02/2018   PLT 225 05/02/2018   Lab Results  Component Value Date   NA 139 05/02/2018   K 4.1 05/02/2018   CO2 28 05/02/2018   GLUCOSE 82 05/02/2018   BUN 14 05/02/2018   CREATININE 0.71 05/02/2018   BILITOT 0.5 05/02/2018   ALKPHOS 76 06/07/2017   AST 11 05/02/2018   ALT 11 05/02/2018   PROT 6.6 05/02/2018   ALBUMIN 3.9 06/07/2017   CALCIUM 9.3 05/02/2018   Lab Results  Component Value Date   CHOL 139 12/02/2014   Lab Results  Component Value Date   HDL 61 12/02/2014   Lab Results  Component Value Date   LDLCALC 69 12/02/2014   Lab Results  Component Value Date   TRIG 46 12/02/2014   Lab Results  Component Value Date   CHOLHDL 2.3 12/02/2014   Lab Results  Component Value Date   HGBA1C 5.0 12/02/2014       Assessment & Plan:   Problem List Items Addressed This Visit    None    Visit Diagnoses    Epigastric pain    -  Primary   Nausea        Epigastric pain with nausea-suspect gastritis, GERD versus dyspepsia versus gastric ulcer.  She  actually had a negative H. pylori last year so not to retest that today.  We discussed  some major dietary changes, including avoiding greasy, spicy foods.  Avoiding acidic foods and things like coffee and caffeine.  I also want her to do a two-week trial of lansoprazole.  She did let me know that she and her husband are trying to get pregnant and did double check to make sure that it was safe for her to do a 2-week trial.  She could also try using Tums as needed and see if she gets temporary relief with that.  With changes in her diet and a trial of PPI for 2 weeks if she does not improve then I would like to refer her to GI for further work-up.  Does not seem to be gallbladder as it does not seem to be worsened or triggered by food intake.   Meds ordered this encounter  Medications  . lansoprazole (PREVACID) 30 MG capsule    Sig: Take 1 capsule (30 mg total) by mouth daily at 12 noon.    Dispense:  30 capsule    Refill:  1     Nani Gasseratherine , MD

## 2019-06-26 NOTE — Patient Instructions (Addendum)
Prevacid is Ok to use during pregnancy so OK to try a two week trial.      Gastroesophageal Reflux Disease, Adult Gastroesophageal reflux (GER) happens when acid from the stomach flows up into the tube that connects the mouth and the stomach (esophagus). Normally, food travels down the esophagus and stays in the stomach to be digested. With GER, food and stomach acid sometimes move back up into the esophagus. You may have a disease called gastroesophageal reflux disease (GERD) if the reflux:  Happens often.  Causes frequent or very bad symptoms.  Causes problems such as damage to the esophagus. When this happens, the esophagus becomes sore and swollen (inflamed). Over time, GERD can make small holes (ulcers) in the lining of the esophagus. What are the causes? This condition is caused by a problem with the muscle between the esophagus and the stomach. When this muscle is weak or not normal, it does not close properly to keep food and acid from coming back up from the stomach. The muscle can be weak because of:  Tobacco use.  Pregnancy.  Having a certain type of hernia (hiatal hernia).  Alcohol use.  Certain foods and drinks, such as coffee, chocolate, onions, and peppermint. What increases the risk? You are more likely to develop this condition if you:  Are overweight.  Have a disease that affects your connective tissue.  Use NSAID medicines. What are the signs or symptoms? Symptoms of this condition include:  Heartburn.  Difficult or painful swallowing.  The feeling of having a lump in the throat.  A bitter taste in the mouth.  Bad breath.  Having a lot of saliva.  Having an upset or bloated stomach.  Belching.  Chest pain. Different conditions can cause chest pain. Make sure you see your doctor if you have chest pain.  Shortness of breath or noisy breathing (wheezing).  Ongoing (chronic) cough or a cough at night.  Wearing away of the surface of teeth  (tooth enamel).  Weight loss. How is this treated? Treatment will depend on how bad your symptoms are. Your doctor may suggest:  Changes to your diet.  Medicine.  Surgery. Follow these instructions at home: Eating and drinking   Follow a diet as told by your doctor. You may need to avoid foods and drinks such as: ? Coffee and tea (with or without caffeine). ? Drinks that contain alcohol. ? Energy drinks and sports drinks. ? Bubbly (carbonated) drinks or sodas. ? Chocolate and cocoa. ? Peppermint and mint flavorings. ? Garlic and onions. ? Horseradish. ? Spicy and acidic foods. These include peppers, chili powder, curry powder, vinegar, hot sauces, and BBQ sauce. ? Citrus fruit juices and citrus fruits, such as oranges, lemons, and limes. ? Tomato-based foods. These include red sauce, chili, salsa, and pizza with red sauce. ? Fried and fatty foods. These include donuts, french fries, potato chips, and high-fat dressings. ? High-fat meats. These include hot dogs, rib eye steak, sausage, ham, and bacon. ? High-fat dairy items, such as whole milk, butter, and cream cheese.  Eat small meals often. Avoid eating large meals.  Avoid drinking large amounts of liquid with your meals.  Avoid eating meals during the 2-3 hours before bedtime.  Avoid lying down right after you eat.  Do not exercise right after you eat. Lifestyle   Do not use any products that contain nicotine or tobacco. These include cigarettes, e-cigarettes, and chewing tobacco. If you need help quitting, ask your doctor.  Try to  lower your stress. If you need help doing this, ask your doctor.  If you are overweight, lose an amount of weight that is healthy for you. Ask your doctor about a safe weight loss goal. General instructions  Pay attention to any changes in your symptoms.  Take over-the-counter and prescription medicines only as told by your doctor. Do not take aspirin, ibuprofen, or other NSAIDs  unless your doctor says it is okay.  Wear loose clothes. Do not wear anything tight around your waist.  Raise (elevate) the head of your bed about 6 inches (15 cm).  Avoid bending over if this makes your symptoms worse.  Keep all follow-up visits as told by your doctor. This is important. Contact a doctor if:  You have new symptoms.  You lose weight and you do not know why.  You have trouble swallowing or it hurts to swallow.  You have wheezing or a cough that keeps happening.  Your symptoms do not get better with treatment.  You have a hoarse voice. Get help right away if:  You have pain in your arms, neck, jaw, teeth, or back.  You feel sweaty, dizzy, or light-headed.  You have chest pain or shortness of breath.  You throw up (vomit) and your throw-up looks like blood or coffee grounds.  You pass out (faint).  Your poop (stool) is bloody or black.  You cannot swallow, drink, or eat. Summary  If a person has gastroesophageal reflux disease (GERD), food and stomach acid move back up into the esophagus and cause symptoms or problems such as damage to the esophagus.  Treatment will depend on how bad your symptoms are.  Follow a diet as told by your doctor.  Take all medicines only as told by your doctor. This information is not intended to replace advice given to you by your health care provider. Make sure you discuss any questions you have with your health care provider. Document Released: 05/07/2008 Document Revised: 05/28/2018 Document Reviewed: 05/28/2018 Elsevier Patient Education  2020 [employer]

## 2019-06-26 NOTE — Progress Notes (Signed)
Pt reports that she has been experiencing pain at the end of her sternum. This has been on and off. She experiences nausea but hasn't vomited. She said that the pain is dull and she starts to feel hot when it happens. Denies f/s/c/v/d. Last BM was yesterday and it was normal.Deonta Bomberger, [place] Crocker

## 2019-07-09 ENCOUNTER — Telehealth: Payer: Self-pay

## 2019-07-09 NOTE — Telephone Encounter (Signed)
[place] left a message for a return call. I called and left a message for her to call back.

## 2019-07-10 ENCOUNTER — Ambulatory Visit (INDEPENDENT_AMBULATORY_CARE_PROVIDER_SITE_OTHER): Payer: No Typology Code available for payment source | Admitting: Physician Assistant

## 2019-07-10 ENCOUNTER — Encounter: Payer: Self-pay | Admitting: Physician Assistant

## 2019-07-10 ENCOUNTER — Other Ambulatory Visit: Payer: Self-pay

## 2019-07-10 VITALS — BP 111/71 | HR 65 | Temp 98.2°F | Resp 14 | Wt 114.0 lb

## 2019-07-10 DIAGNOSIS — R1013 Epigastric pain: Secondary | ICD-10-CM | POA: Diagnosis not present

## 2019-07-10 DIAGNOSIS — R519 Headache, unspecified: Secondary | ICD-10-CM

## 2019-07-10 DIAGNOSIS — Z1329 Encounter for screening for other suspected endocrine disorder: Secondary | ICD-10-CM | POA: Diagnosis not present

## 2019-07-10 DIAGNOSIS — D721 Eosinophilia, unspecified: Secondary | ICD-10-CM

## 2019-07-10 DIAGNOSIS — R110 Nausea: Secondary | ICD-10-CM | POA: Diagnosis not present

## 2019-07-10 DIAGNOSIS — R140 Abdominal distension (gaseous): Secondary | ICD-10-CM

## 2019-07-10 DIAGNOSIS — R1084 Generalized abdominal pain: Secondary | ICD-10-CM | POA: Diagnosis not present

## 2019-07-10 DIAGNOSIS — R51 Headache: Secondary | ICD-10-CM

## 2019-07-10 LAB — POCT URINALYSIS DIPSTICK
Bilirubin, UA: NEGATIVE
Blood, UA: NEGATIVE
Glucose, UA: NEGATIVE
Ketones, UA: NEGATIVE
Leukocytes, UA: NEGATIVE
Nitrite, UA: NEGATIVE
Protein, UA: NEGATIVE
Spec Grav, UA: 1.01 (ref 1.010–1.025)
Urobilinogen, UA: 0.2 E.U./dL
pH, UA: 7 (ref 5.0–8.0)

## 2019-07-10 LAB — POCT URINE PREGNANCY: Preg Test, Ur: NEGATIVE

## 2019-07-10 LAB — TSH+FREE T4: TSH W/REFLEX TO FT4: 0.78 mIU/L

## 2019-07-10 MED ORDER — CULTURELLE DIGESTIVE WOMENS PO CAPS: 1.0000 | ORAL_CAPSULE | Freq: Every day | ORAL | Status: DC

## 2019-07-10 NOTE — Patient Instructions (Addendum)
Start an OTC probiotic like Culturelle Follow IBS eating plan Keep a food diary and document what you ate when you experience symptoms   Diet for Irritable Bowel Syndrome When you have irritable bowel syndrome (IBS), it is very important to eat the foods and follow the eating habits that are best for your condition. IBS may cause various symptoms such as pain in the abdomen, constipation, or diarrhea. Choosing the right foods can help to ease the discomfort from these symptoms. Work with your health care provider and diet and nutrition specialist (dietitian) to find the eating plan that will help to control your symptoms. What are tips for following this plan?      Keep a food diary. This will help you identify foods that cause symptoms. Write down: ? What you eat and when you eat it. ? What symptoms you have. ? When symptoms occur in relation to your meals, such as "pain in abdomen 2 hours after dinner."  Eat your meals slowly and in a relaxed setting.  Aim to eat 5-6 small meals per day. Do not skip meals.  Drink enough fluid to keep your urine pale yellow.  Ask your health care provider if you should take an over-the-counter probiotic to help restore healthy bacteria in your gut (digestive tract). ? Probiotics are foods that contain good bacteria and yeasts.  Your dietitian may have specific dietary recommendations for you based on your symptoms. He or she may recommend that you: ? Avoid foods that cause symptoms. Talk with your dietitian about other ways to get the same nutrients that are in those problem foods. ? Avoid foods with gluten. Gluten is a protein that is found in rye, wheat, and barley. ? Eat more foods that contain soluble fiber. Examples of foods with high soluble fiber include oats, seeds, and certain fruits and vegetables. Take a fiber supplement if directed by your dietitian. ? Reduce or avoid certain foods called FODMAPs. These are foods that contain carbohydrates  that are hard to digest. Ask your doctor which foods contain these carbohydrates. What foods are not recommended? The following are some foods and drinks that may make your symptoms worse:  Fatty foods, such as french fries.  Foods that contain gluten, such as pasta and cereal.  Dairy products, such as milk, cheese, and ice cream.  Chocolate.  Alcohol.  Products with caffeine, such as coffee.  Carbonated drinks, such as soda.  Foods that are high in FODMAPs. These include certain fruits and vegetables.  Products with sweeteners such as honey, high fructose corn syrup, sorbitol, and mannitol. The items listed above may not be a complete list of foods and beverages you should avoid. Contact a dietitian for more information. What foods are good sources of fiber? Your health care provider or dietitian may recommend that you eat more foods that contain fiber. Fiber can help to reduce constipation and other IBS symptoms. Add foods with fiber to your diet a little at a time so your body can get used to them. Too much fiber at one time might cause gas and swelling of your abdomen. The following are some foods that are good sources of fiber:  Berries, such as raspberries, strawberries, and blueberries.  Tomatoes.  Carrots.  Brown rice.  Oats.  Seeds, such as chia and pumpkin seeds. The items listed above may not be a complete list of recommended sources of fiber. Contact your dietitian for more options. Where to find more information  [employer] for Functional  Gastrointestinal Disorders: www.iffgd.[employer]  National Institute of Diabetes and Digestive and Kidney Diseases: [employer].tnwww.niddk.nih.gov Summary  When you have irritable bowel syndrome (IBS), it is very important to eat the foods and follow the eating habits that are best for your condition.  IBS may cause various symptoms such as pain in the abdomen, constipation, or diarrhea.  Choosing the right foods can help to  ease the discomfort that comes from symptoms.  Keep a food diary. This will help you identify foods that cause symptoms.  Your health care provider or diet and nutrition specialist (dietitian) may recommend that you eat more foods that contain fiber. This information is not intended to replace advice given to you by your health care provider. Make sure you discuss any questions you have with your health care provider. Document Released: 02/09/2004 Document Revised: 03/11/2019 Document Reviewed: 07/23/2017 Elsevier Patient Education  2020 [employer]

## 2019-07-10 NOTE — Progress Notes (Signed)
HPI:                                                                Dana Eaton is a 33 y.o. female who presents to [facility]: [facility] today for abdominal pain and anusea  Patient reports for over 1 year she has had waxing/waning generalized abdominal pain described as pressure and squeezing. It is worse after eating, but not associated with any particular foods. She has had pain in her epigastrium that was sharp and severe on 1 occasion which prompted her to seek eval a few weeks ago. Endorses nausea without vomiting and bloating and decreased appetite. Also endorses frontal headaches multiple days per week. Denies change in bowel habits. Denies fecal urgency, tenesmus, hematochezia, melena.  She was evaluated by my colleague, Dr.Metheney on 06/26/19 and treated for suspected gastritis/GERD with 2 week trial of Lansoprazole and Tums. She was evaluated last year and had a negative H. Pylori breath test. She states Prevacid and dietary modification has not been helpful, and symptoms are worsening.   Has been drinking liquid IV for the headaches.   Of note, she is trying to get pregnant. LMP 06/15/19. Last sexual activity was 06/25/19.  Past Medical History:  Diagnosis Date  . Anemia    Past Surgical History:  Procedure Laterality Date  . MANDIBLE SURGERY     Social History   Tobacco Use  . Smoking status: Never Smoker  . Smokeless tobacco: Never Used  Substance Use Topics  . Alcohol use: No    Alcohol/week: 0.0 standard drinks   family history includes Osteoarthritis in her maternal grandmother; Osteoporosis in her maternal grandmother; Pancreatic cancer (age of onset: 71) in her maternal grandmother; Seizures in her father.    ROS: negative except as noted in the HPI  Medications: Current Outpatient Medications  Medication Sig Dispense Refill  . Prenatal Vit-Fe Fumarate-FA (PRENATAL MULTIVITAMIN) TABS tablet Take 1 tablet by  mouth daily at 12 noon.    . Lactobacillus (CULTURELLE DIGESTIVE WOMENS) CAPS Take 1 capsule by mouth daily.     No current facility-administered medications for this visit.    No Known Allergies     Objective:  BP 111/71   Pulse 65   Temp 98.2 F (36.8 C) (Oral)   Resp 14   Wt 114 lb (51.7 kg)   LMP 06/15/2019   SpO2 96%   BMI 19.57 kg/m   Wt Readings from Last 3 Encounters:  07/10/19 114 lb (51.7 kg)  06/26/19 114 lb (51.7 kg)  06/17/19 113 lb (51.3 kg)    Gen:  alert, not ill-appearing, no distress, appropriate for age 16: head normocephalic without obvious abnormality, conjunctiva and cornea clear, wearing glasses, trachea midline Pulm: Normal work of breathing, normal phonation, clear to auscultation bilaterally, no wheezes, rales or rhonchi CV: Normal rate, regular rhythm, s1 and s2 distinct, no murmurs, clicks or rubs  GI: abdomen soft, non-tender, non-distended, no guarding, negative Murphy's sign, no palpable masses, no organomegaly Neuro: alert and oriented x 3, no tremor MSK: extremities atraumatic, normal gait and station Skin: intact, no rashes on exposed skin, no jaundice, no cyanosis Psych: well-groomed, cooperative, good eye contact, euthymic mood, affect mood-congruent, speech is articulate, and thought processes clear and goal-directed  Lab Results  Component Value Date   CREATININE 0.71 05/02/2018   BUN 14 05/02/2018   NA 139 05/02/2018   K 4.1 05/02/2018   CL 104 05/02/2018   CO2 28 05/02/2018    Lab Results  Component Value Date   ALT 11 05/02/2018   AST 11 05/02/2018   ALKPHOS 76 06/07/2017   BILITOT 0.5 05/02/2018   Lab Results  Component Value Date   WBC 5.8 05/02/2018   HGB 12.8 05/02/2018   HCT 36.9 05/02/2018   MCV 92.3 05/02/2018   PLT 225 05/02/2018     Results for orders placed or performed in visit on 07/10/19 (from the past 72 hour(s))  POCT urine pregnancy     Status: Normal   Collection Time: 07/10/19 10:12 AM   Result Value Ref Range   Preg Test, Ur Negative Negative  POCT Urinalysis Dipstick     Status: Normal   Collection Time: 07/10/19 10:22 AM  Result Value Ref Range   Color, UA yellow    Clarity, UA clear    Glucose, UA Negative Negative   Bilirubin, UA negative    Ketones, UA negative    Spec Grav, UA 1.010 1.010 - 1.025   Blood, UA negative    pH, UA 7.0 5.0 - 8.0   Protein, UA Negative Negative   Urobilinogen, UA 0.2 0.2 or 1.0 E.U./dL   Nitrite, UA negative    Leukocytes, UA Negative Negative   Appearance     Odor     No results found.    Assessment and Plan: 33 y.o. female with   .Dana Eaton was seen today for abdominal pain.  Diagnoses and all orders for this visit:  Generalized abdominal pain -     POCT urine pregnancy -     US Abdomen Limited RUQ -     COMPLETE METABOLIC PANEL WITH GFR -     Lipase -     CBC with Differential/Platelet -     TSH + free T4 -     POCT Urinalysis Dipstick -     B-HCG Quant -     Ambulatory referral to Gastroenterology -     Lactobacillus (CULTURELLE DIGESTIVE WOMENS) CAPS; Take 1 capsule by mouth daily.  Epigastric pain  Nausea -     POCT urine pregnancy -     US Abdomen Limited RUQ -     COMPLETE METABOLIC PANEL WITH GFR -     Lipase -     CBC with Differential/Platelet -     TSH + free T4 -     POCT Urinalysis Dipstick -     B-HCG Quant -     Ambulatory referral to Gastroenterology  Screening for thyroid disorder -     TSH + free T4  Recurrent headache  Abdominal bloating    Benign abdominal exam, weight is stable, no red flag symptoms DDx includes IBS/functional abdominal pain syndrome, biliary tree pathology and less likely abdominal migraine UA and UPT negative today CBC, CMP, lipase pending US RUQ pending D/C Prevacid Counseled on IBS eating plan Start OTC probiotic daily Counseled to use food diary and headache diary to track symptoms Consider f/u with GI if work-up negative and no improvement with  this plan  Patient education and anticipatory guidance given Patient agrees with treatment plan Follow-up as needed if symptoms worsen or fail to improve  Levonne Hubertharley E. Viriginia Amendola PA-C

## 2019-07-11 LAB — CBC WITH DIFFERENTIAL/PLATELET
Absolute Monocytes: 466 cells/uL (ref 200–950)
Basophils Absolute: 26 cells/uL (ref 0–200)
Basophils Relative: 0.3 %
Eosinophils Absolute: 1496 cells/uL — ABNORMAL HIGH (ref 15–500)
Eosinophils Relative: 17 %
HCT: 37.9 % (ref 35.0–45.0)
Hemoglobin: 12.7 g/dL (ref 11.7–15.5)
Lymphs Abs: 2385 cells/uL (ref 850–3900)
MCH: 31.2 pg (ref 27.0–33.0)
MCHC: 33.5 g/dL (ref 32.0–36.0)
MCV: 93.1 fL (ref 80.0–100.0)
MPV: 12.8 fL — ABNORMAL HIGH (ref 7.5–12.5)
Monocytes Relative: 5.3 %
Neutro Abs: 4426 cells/uL (ref 1500–7800)
Neutrophils Relative %: 50.3 %
Platelets: 185 10*3/uL (ref 140–400)
RBC: 4.07 10*6/uL (ref 3.80–5.10)
RDW: 11.6 % (ref 11.0–15.0)
Total Lymphocyte: 27.1 %
WBC: 8.8 10*3/uL (ref 3.8–10.8)

## 2019-07-11 LAB — COMPLETE METABOLIC PANEL WITH GFR
AG Ratio: 1.9 (calc) (ref 1.0–2.5)
ALT: 14 U/L (ref 6–29)
AST: 13 U/L (ref 10–30)
Albumin: 4.5 g/dL (ref 3.6–5.1)
Alkaline phosphatase (APISO): 39 U/L (ref 31–125)
BUN: 16 mg/dL (ref 7–25)
CO2: 28 mmol/L (ref 20–32)
Calcium: 9.2 mg/dL (ref 8.6–10.2)
Chloride: 103 mmol/L (ref 98–110)
Creat: 0.68 mg/dL (ref 0.50–1.10)
GFR, Est African American: 135 mL/min/{1.73_m2} (ref >=60)
GFR, Est Non African American: 117 mL/min/{1.73_m2} (ref >=60)
Globulin: 2.4 g/dL (calc) (ref 1.9–3.7)
Glucose, Bld: 84 mg/dL (ref 65–99)
Potassium: 4.2 mmol/L (ref 3.5–5.3)
Sodium: 137 mmol/L (ref 135–146)
Total Bilirubin: 0.5 mg/dL (ref 0.2–1.2)
Total Protein: 6.9 g/dL (ref 6.1–8.1)

## 2019-07-11 LAB — HCG, QUANTITATIVE, PREGNANCY: HCG, Total, QN: <3 m[IU]/mL

## 2019-07-11 LAB — LIPASE: Lipase: 32 U/L (ref 7–60)

## 2019-07-12 ENCOUNTER — Encounter: Payer: Self-pay | Admitting: Physician Assistant

## 2019-07-12 DIAGNOSIS — D721 Eosinophilia, unspecified: Secondary | ICD-10-CM | POA: Insufficient documentation

## 2019-07-12 NOTE — Addendum Note (Signed)
Addended by: Nelson Chimes E on: 07/12/2019 11:00 PM   Modules accepted: Orders

## 2019-07-13 ENCOUNTER — Encounter: Payer: Self-pay | Admitting: Physician Assistant

## 2019-07-16 ENCOUNTER — Ambulatory Visit (INDEPENDENT_AMBULATORY_CARE_PROVIDER_SITE_OTHER): Payer: No Typology Code available for payment source

## 2019-07-16 ENCOUNTER — Other Ambulatory Visit: Payer: Self-pay

## 2019-07-16 DIAGNOSIS — R110 Nausea: Secondary | ICD-10-CM | POA: Diagnosis not present

## 2019-07-16 DIAGNOSIS — R1084 Generalized abdominal pain: Secondary | ICD-10-CM | POA: Diagnosis not present

## 2019-07-17 ENCOUNTER — Encounter: Payer: Self-pay | Admitting: Physician Assistant

## 2019-07-17 DIAGNOSIS — R110 Nausea: Secondary | ICD-10-CM

## 2019-07-17 DIAGNOSIS — R1084 Generalized abdominal pain: Secondary | ICD-10-CM

## 2019-07-20 LAB — CBC (INCLUDES DIFF/PLT) WITH PATHOLOGIST REVIEW
Absolute Monocytes: 512 cells/uL (ref 200–950)
Basophils Absolute: 59 cells/uL (ref 0–200)
Basophils Relative: 0.7 %
Eosinophils Absolute: 1159 cells/uL — ABNORMAL HIGH (ref 15–500)
Eosinophils Relative: 13.8 %
HCT: 38.2 % (ref 35.0–45.0)
Hemoglobin: 12.7 g/dL (ref 11.7–15.5)
Lymphs Abs: 2822 cells/uL (ref 850–3900)
MCH: 31.3 pg (ref 27.0–33.0)
MCHC: 33.2 g/dL (ref 32.0–36.0)
MCV: 94.1 fL (ref 80.0–100.0)
MPV: 12.6 fL — ABNORMAL HIGH (ref 7.5–12.5)
Monocytes Relative: 6.1 %
Neutro Abs: 3847 cells/uL (ref 1500–7800)
Neutrophils Relative %: 45.8 %
Platelets: 206 10*3/uL (ref 140–400)
RBC: 4.06 10*6/uL (ref 3.80–5.10)
RDW: 11.6 % (ref 11.0–15.0)
Total Lymphocyte: 33.6 %
WBC: 8.4 10*3/uL (ref 3.8–10.8)

## 2019-07-21 ENCOUNTER — Telehealth: Payer: Self-pay | Admitting: Physician Assistant

## 2019-07-21 NOTE — Telephone Encounter (Signed)
Per patient after thoroughly asking COVID questions/screening. Patient will be coming in the office. No further questions at this time.

## 2019-07-21 NOTE — Telephone Encounter (Signed)
Patient states that a couple of days ago she had a low grade fever of 100.2 and she also wanted to get swollen lymph nodes checked out. Wanted to double check, patient states it was okay for her to come in office. Please advice if you want me to change visit to a virtual or okay to leave as in office.

## 2019-07-21 NOTE — Telephone Encounter (Signed)
If she passes the other COVID screening questions then she can be seen in the office

## 2019-07-23 ENCOUNTER — Encounter: Payer: Self-pay | Admitting: Physician Assistant

## 2019-07-23 ENCOUNTER — Ambulatory Visit (INDEPENDENT_AMBULATORY_CARE_PROVIDER_SITE_OTHER): Payer: No Typology Code available for payment source | Admitting: Physician Assistant

## 2019-07-23 ENCOUNTER — Other Ambulatory Visit: Payer: Self-pay

## 2019-07-23 VITALS — BP 125/84 | HR 73 | Temp 98.1°F | Wt 114.0 lb

## 2019-07-23 DIAGNOSIS — R509 Fever, unspecified: Secondary | ICD-10-CM | POA: Diagnosis not present

## 2019-07-23 DIAGNOSIS — G43009 Migraine without aura, not intractable, without status migrainosus: Secondary | ICD-10-CM

## 2019-07-23 DIAGNOSIS — D721 Eosinophilia, unspecified: Secondary | ICD-10-CM

## 2019-07-23 NOTE — Patient Instructions (Signed)
For your migraines - headache diary (paper or app like MigraineBuddy, iHeadache) - optional B2 (riboflavin) and Magnesium for prophylaxis - aim to get at least 7-8 hr per night and stick to the same bedtime routine (bed time and wake up time) - drink 11 cups of water per day - reduce caffeine intake - avoid alcohol - don't skip meal   Recurrent Migraine Headache  Migraines are a type of headache, and they are usually stronger and more sudden than normal headaches (tension headaches). Migraines are characterized by an intense pulsing, throbbing pain that is usually only present on one side of the head. Sometimes, migraine headaches can cause nausea, vomiting, sensitivity to light and sound, and vision changes. Recurrent migraines keep coming back (recurring). A migraine can last from 4 hours up to 3 days. What are the causes? The exact cause of this condition is not known. However, a migraine may be caused when nerves in the brain become irritated and release chemicals that cause inflammation of blood vessels. This inflammation causes pain. Certain things may also trigger migraines, such as:  A disruption in your regular eating and sleeping schedule.  Smoking.  Stress.  Menstruation.  Certain foods and drinks, such as: ? Aged cheese. ? Chocolate. ? Alcohol. ? Caffeine. ? Foods or drinks that contain nitrates, glutamate, aspartame, MSG, or tyramine.  Lack of sleep.  Hunger.  Physical exertion.  Fatigue.  High altitude.  Weather changes.  Medicines, such as: ? Nitroglycerin, which is used to treat chest pain. ? Birth control pills. ? Estrogen. ? Some blood pressure medicines. What are the signs or symptoms? Symptoms of this condition vary for each person and may include:  Pain that is usually only present on one side of the head. In some cases, the pain may be on both sides of the head or around the head or neck.  Pulsating or throbbing pain.  Severe pain that  prevents daily activities.  Pain that is aggravated by any physical activity.  Nausea, vomiting, or both.  Dizziness.  Pain with exposure to bright lights, loud noises, or activity.  General sensitivity to bright lights, loud noises, or smells. Before you get a migraine, you may get warning signs that a migraine is coming (aura). An aura may include:  Seeing flashing lights.  Seeing bright spots, halos, or zigzag lines.  Having tunnel vision or blurred vision.  Having numbness or a tingling feeling.  Having trouble talking.  Having muscle weakness.  Smelling a certain odor. How is this diagnosed? This condition is often diagnosed based on:  Your symptoms and medical history.  A physical exam. You may also have tests, including:  A CT scan or MRI of your brain. These imaging tests cannot diagnose migraines, but they can help to rule out other causes of headaches.  Blood tests. How is this treated? This condition is treated with:  Medicines. These are used for: ? Lessening pain and nausea. ? Preventing recurrent migraines.  Lifestyle changes, such as changes to your diet or sleeping patterns.  Behavior therapy, such as relaxation training or biofeedback. Biofeedback is a treatment that involves teaching you to relax and use your brain to lower your heart rate and control your breathing. Follow these instructions at home: Medicines  Take over-the-counter and prescription medicines only as told by your health care provider.  Do not drive or use heavy machinery while taking prescription pain medicine. Lifestyle  Do not use any products that contain nicotine or tobacco, such as  cigarettes and e-cigarettes. If you need help quitting, ask your health care provider.  Limit alcohol intake to no more than 1 drink a day for nonpregnant women and 2 drinks a day for men. One drink equals 12 oz of beer, 5 oz of wine, or 1 oz of hard liquor.  Get 7-9 hours of sleep each  night, or the amount of sleep recommended by your health care provider.  Limit your stress. Talk with your health care provider if you need help with stress management.  Maintain a healthy weight. If you need help losing weight, ask your health care provider.  Exercise regularly. Aim for 150 minutes of moderate-intensity exercise (walking, biking, yoga) or 75 minutes of vigorous exercise (running, circuit training, swimming) each week. General instructions   Keep a journal to find out what triggers your migraine headaches so you can avoid these triggers. For example, write down: ? What you eat and drink. ? How much sleep you get. ? Any change to your diet or medicines.  Lie down in a dark, quiet room when you have a migraine.  Try placing a cool towel over your head when you have a migraine.  Keep lights dim, if bright lights bother you and make your migraines worse.  Keep all follow-up visits as told by your health care provider. This is important. Contact a health care provider if:  Your pain does not improve, even with medicine.  Your migraines continue to return, even with medicine.  You have a fever.  You have weight loss. Get help right away if:  Your migraine becomes severe and medicine does not help.  You have a stiff neck.  You have a loss of vision.  You have muscle weakness or loss of muscle control.  You start losing your balance or have trouble walking.  You feel faint or you pass out.  You develop new, severe symptoms.  You start having abrupt severe headaches that last for a second or less, like a thunderclap. Summary  Migraine headaches are usually stronger and more sudden than normal headaches (tension headaches). Migraines are characterized by an intense pulsing, throbbing pain that is usually only present on one side of the head.  The exact cause of this condition is not known. However, a migraine may be caused when nerves in the brain become  irritated and release chemicals that cause inflammation of blood vessels.  Certain things may trigger migraines, such as changes to diet or sleeping patterns, smoking, certain foods, alcohol, stress, and certain medicines.  Sometimes, migraine headaches can cause nausea, vomiting, sensitivity to light and sound, and vision changes.  Migraines are often diagnosed based on your symptoms, medical history, and a physical exam. This information is not intended to replace advice given to you by your health care provider. Make sure you discuss any questions you have with your health care provider. Document Released: 08/14/2001 Document Revised: 11/22/2017 Document Reviewed: 08/31/2016 Elsevier Patient Education  2020 [employer]  Propranolol tablets What is this medicine? PROPRANOLOL (proe PRAN oh lole) is a beta-blocker. Beta-blockers reduce the workload on the heart and help it to beat more regularly. This medicine is used to treat high blood pressure, to control irregular heart rhythms (arrhythmias) and to relieve chest pain caused by angina. It may also be helpful after a heart attack. This medicine is also used to prevent migraine headaches, relieve uncontrollable shaking (tremors), and help certain problems related to the thyroid gland and adrenal gland. This medicine may be  used for other purposes; ask your health care provider or pharmacist if you have questions. COMMON BRAND NAME(S): Inderal What should I tell my health care provider before I take this medicine? They need to know if you have any of these conditions:  circulation problems or blood vessel disease  diabetes  history of heart attack or heart disease, vasospastic angina  kidney disease  liver disease  lung or breathing disease, like asthma or emphysema  pheochromocytoma  slow heart rate  thyroid disease  an unusual or allergic reaction to propranolol, other beta-blockers, medicines, foods, dyes, or preservatives   pregnant or trying to get pregnant  breast-feeding How should I use this medicine? Take this medicine by mouth with a glass of water. Follow the directions on the prescription label. Take your doses at regular intervals. Do not take your medicine more often than directed. Do not stop taking except on your the advice of your doctor or health care professional. Talk to your pediatrician regarding the use of this medicine in children. Special care may be needed. Overdosage: If you think you have taken too much of this medicine contact a poison control center or emergency room at once. NOTE: This medicine is only for you. Do not share this medicine with others. What if I miss a dose? If you miss a dose, take it as soon as you can. If it is almost time for your next dose, take only that dose. Do not take double or extra doses. What may interact with this medicine? Do not take this medicine with any of the following medications:  feverfew  phenothiazines like chlorpromazine, mesoridazine, prochlorperazine, thioridazine This medicine may also interact with the following medications:  aluminum hydroxide gel  antipyrine  antiviral medicines for HIV or AIDS  barbiturates like phenobarbital  certain medicines for blood pressure, heart disease, irregular heart beat  cimetidine  ciprofloxacin  diazepam  fluconazole  haloperidol  isoniazid  medicines for cholesterol like cholestyramine or colestipol  medicines for mental depression  medicines for migraine headache like almotriptan, eletriptan, frovatriptan, naratriptan, rizatriptan, sumatriptan, zolmitriptan  NSAIDs, medicines for pain and inflammation, like ibuprofen or naproxen  phenytoin  rifampin  teniposide  theophylline  thyroid medicines  tolbutamide  warfarin  zileuton This list may not describe all possible interactions. Give your health care provider a list of all the medicines, herbs, non-prescription  drugs, or dietary supplements you use. Also tell them if you smoke, drink alcohol, or use illegal drugs. Some items may interact with your medicine. What should I watch for while using this medicine? Visit your doctor or health care professional for regular check ups. Check your blood pressure and pulse rate regularly. Ask your health care professional what your blood pressure and pulse rate should be, and when you should contact them. You may get drowsy or dizzy. Do not drive, use machinery, or do anything that needs mental alertness until you know how this drug affects you. Do not stand or sit up quickly, especially if you are an older patient. This reduces the risk of dizzy or fainting spells. Alcohol can make you more drowsy and dizzy. Avoid alcoholic drinks. This medicine may increase blood sugar. Ask your healthcare provider if changes in diet or medicines are needed if you have diabetes. Do not treat yourself for coughs, colds, or pain while you are taking this medicine without asking your doctor or health care professional for advice. Some ingredients may increase your blood pressure. What side effects may I notice  from receiving this medicine? Side effects that you should report to your doctor or health care professional as soon as possible:  allergic reactions like skin rash, itching or hives, swelling of the face, lips, or tongue  breathing problems  cold hands or feet  difficulty sleeping, nightmares  dry peeling skin  hallucinations  muscle cramps or weakness   signs and symptoms of high blood sugar such as being more thirsty or hungry or having to urinate more than normal. You may also feel very tired or have blurry vision.  slow heart rate  swelling of the legs and ankles  vomiting Side effects that usually do not require medical attention (report to your doctor or health care professional if they continue or are bothersome):  change in sex drive or performance   diarrhea  dry sore eyes  hair loss  nausea  weak or tired This list may not describe all possible side effects. Call your doctor for medical advice about side effects. You may report side effects to FDA at 1-800-FDA-1088. Where should I keep my medicine? Keep out of the reach of children. Store at room temperature between 15 and 30 degrees C (59 and 86 degrees F). Protect from light. Throw away any unused medicine after the expiration date. NOTE: This sheet is a summary. It may not cover all possible information. If you have questions about this medicine, talk to your doctor, pharmacist, or health care provider.  2020 Elsevier/Gold Standard (2018-09-10 08:41:59)

## 2019-07-23 NOTE — Progress Notes (Signed)
HPI:                                                                Dana Eaton is a 33 y.o. female who [facility] today for fever  Today patient reports for the first time that she has actually been experiencing recurrent low grade fevers, 99-100.4, for the last 8 months. Last fever was 100.2 on 8/14. Endorses rare nightsweats approx every other week, last occurred 1 week ago.  No unintentional weight loss. Recent CBC showed mild Eosinophilia. Since she has had recurrent nausea/vomiting/abdominal pain stool studies were ordered to rule out parasitic infection and results are pending. She had a normal RUQ US on 07/16/19.  She states her headaches are becoming more severe and she has had photosensitivity and occasional dizziness with them. Has been drinking "liquid IV" and reports this is helpful.    Past Medical History:  Diagnosis Date  . Anemia    Past Surgical History:  Procedure Laterality Date  . MANDIBLE SURGERY     Social History   Tobacco Use  . Smoking status: Never Smoker  . Smokeless tobacco: Never Used  . Tobacco comment: tried it once before-smoking   Substance Use Topics  . Alcohol use: Yes    Alcohol/week: 0.0 standard drinks    Comment: Ocassionally   family history includes Colon cancer in her paternal grandmother; Osteoarthritis in her maternal grandmother; Osteoporosis in her maternal grandmother; Pancreatic cancer (age of onset: 976) in her maternal grandmother; Seizures in her father and sister.    Review of Systems  Constitutional: Positive for appetite change, diaphoresis (rare nightsweats), fatigue and fever. Negative for activity change and unexpected weight change.  HENT:       + dry mouth   Eyes: Positive for photophobia. Negative for visual disturbance.  Gastrointestinal: Positive for abdominal distention, abdominal pain, nausea and vomiting.  Neurological: Positive for dizziness  and headaches.  Psychiatric/Behavioral: Positive for decreased concentration ("brain fog"). The patient is nervous/anxious.   All other systems reviewed and are negative.    Medications: Current Outpatient Medications  Medication Sig Dispense Refill  . Lactobacillus (CULTURELLE DIGESTIVE WOMENS) CAPS Take 1 capsule by mouth daily.    . ondansetron (ZOFRAN ODT) 4 MG disintegrating tablet Take 1 tablet (4 mg total) by mouth every 6 (six) hours as needed for nausea or vomiting. 20 tablet 0  . Prenatal Vit-Fe Fumarate-FA (PRENATAL MULTIVITAMIN) TABS tablet Take 1 tablet by mouth daily at 12 noon.     No current facility-administered medications for this visit.    No Known Allergies     Objective:  BP 125/84   Pulse 73   Temp 98.1 F (36.7 C) (Oral)   Wt 114 lb (51.7 kg)   LMP 07/13/2019   BMI 19.57 kg/m   Wt Readings from Last 3 Encounters:  07/27/19 113 lb 8 oz (51.5 kg)  07/23/19 114 lb (51.7 kg)  07/10/19 114 lb (51.7 kg)   Temp Readings from Last 3 Encounters:  07/27/19 98.3 F (36.8 C)  07/23/19 98.1 F (36.7 C) (Oral)  07/10/19 98.2 F (36.8 C) (Oral)   BP Readings from Last 3 Encounters:  07/27/19 106/70  07/23/19 125/84  07/10/19 111/71   Pulse  Readings from Last 3 Encounters:  07/27/19 67  07/23/19 73  07/10/19 65    Gen:  alert, not ill-appearing, no distress, appropriate for age HEENT: head normocephalic without obvious abnormality, conjunctiva and cornea clear, neck supple, ROM intact, no meningeal signs, trachea midline Pulm: Normal work of breathing, normal phonation, clear to auscultation bilaterally, no wheezes, rales or rhonchi CV: Normal rate, regular rhythm, s1 and s2 distinct, no murmurs, clicks or rubs  Neuro: alert and oriented x 3, no tremor MSK: extremities atraumatic, normal gait and station Skin: intact, no rashes on exposed skin, no jaundice, no cyanosis Lymph: no cervical, supraclavicular, axillary or inguinal adenopathy Psych:  cooperative, speech is articulate, normal rate and volume; thought processes clear and goal-directed, normal judgment, good insight     Recent Results (from the past 2160 hour(s))  POCT urine pregnancy     Status: Normal   Collection Time: 07/10/19 10:12 AM  Result Value Ref Range   Preg Test, Ur Negative Negative  POCT Urinalysis Dipstick     Status: Normal   Collection Time: 07/10/19 10:22 AM  Result Value Ref Range   Color, UA yellow    Clarity, UA clear    Glucose, UA Negative Negative   Bilirubin, UA negative    Ketones, UA negative    Spec Grav, UA 1.010 1.010 - 1.025   Blood, UA negative    pH, UA 7.0 5.0 - 8.0   Protein, UA Negative Negative   Urobilinogen, UA 0.2 0.2 or 1.0 E.U./dL   Nitrite, UA negative    Leukocytes, UA Negative Negative   Appearance     Odor    COMPLETE METABOLIC PANEL WITH GFR     Status: None   Collection Time: 07/10/19 11:06 AM  Result Value Ref Range   Glucose, Bld 84 65 - 99 mg/dL    Comment: .            Fasting reference interval .    BUN 16 7 - 25 mg/dL   Creat 1.610.68 0.960.50 - 0.451.10 mg/dL   GFR, Est Non African American 117 > OR = 60 mL/min/1.8173m2   GFR, Est African American 135 > OR = 60 mL/min/1.7073m2   BUN/Creatinine Ratio NOT APPLICABLE 6 - 22 (calc)   Sodium 137 135 - 146 mmol/L   Potassium 4.2 3.5 - 5.3 mmol/L   Chloride 103 98 - 110 mmol/L   CO2 28 20 - 32 mmol/L   Calcium 9.2 8.6 - 10.2 mg/dL   Total Protein 6.9 6.1 - 8.1 g/dL   Albumin 4.5 3.6 - 5.1 g/dL   Globulin 2.4 1.9 - 3.7 g/dL (calc)   AG Ratio 1.9 1.0 - 2.5 (calc)   Total Bilirubin 0.5 0.2 - 1.2 mg/dL   Alkaline phosphatase (APISO) 39 31 - 125 U/L   AST 13 10 - 30 U/L   ALT 14 6 - 29 U/L  Lipase     Status: None   Collection Time: 07/10/19 11:06 AM  Result Value Ref Range   Lipase 32 7 - 60 U/L  CBC with Differential/Platelet     Status: Abnormal   Collection Time: 07/10/19 11:06 AM  Result Value Ref Range   WBC 8.8 3.8 - 10.8 Thousand/uL   RBC 4.07 3.80 -  5.10 Million/uL   Hemoglobin 12.7 11.7 - 15.5 g/dL   HCT 40.937.9 81.135.0 - 91.445.0 %   MCV 93.1 80.0 - 100.0 fL   MCH 31.2 27.0 - 33.0 pg   MCHC 33.5  32.0 - 36.0 g/dL   RDW 11.6 11.0 - 15.0 %   Platelets 185 140 - 400 Thousand/uL   MPV 12.8 (H) 7.5 - 12.5 fL   Neutro Abs 4,426 1,500 - 7,800 cells/uL   Lymphs Abs 2,385 850 - 3,900 cells/uL   Absolute Monocytes 466 200 - 950 cells/uL   Eosinophils Absolute 1,496 (H) 15 - 500 cells/uL   Basophils Absolute 26 0 - 200 cells/uL   Neutrophils Relative % 50.3 %   Total Lymphocyte 27.1 %   Monocytes Relative 5.3 %   Eosinophils Relative 17.0 %   Basophils Relative 0.3 %  TSH + free T4     Status: None   Collection Time: 07/10/19 11:10 AM  Result Value Ref Range   TSH W/REFLEX TO FT4 0.78 mIU/L    Comment:           Reference Range .           > or = 20 Years  0.40-4.50 .                Pregnancy Ranges           First trimester    0.26-2.66           Second trimester   0.55-2.73           Third trimester    0.43-2.91   B-HCG Quant     Status: None   Collection Time: 07/10/19 11:13 AM  Result Value Ref Range   HCG, Total, QN <3 mIU/mL    Comment: Gestational Age   Expected hCG values (mIU/mL) <1 Week:                 5-50 1-2 Weeks:               50-500 2-3 Weeks:               [phone] 3-4 Weeks:               500-10000 4-5 Weeks:               1000-50000 5-6 Weeks:               10000-100000 6-8 Weeks:               15000-200000 2-3 Months:              10000-100000 The table above provides only a very rough estimate of gestational age and should be used only in conjunction with other methods for establishing gestational age. Much more reliable and accurate estimations of gestational age may be obtained by using LMP or ultrasound. . . Values from different assay methods may vary. The use of this assay to monitor or to diagnose  patients with cancer or any condition unrelated to pregnancy has not been cleared or approved by the  FDA or the manufacturer of the assay.   CBC (INCLUDES DIFF/PLT) WITH PATHOLOGIST REVIEW     Status: Abnormal   Collection Time: 07/17/19  3:32 PM  Result Value Ref Range   WBC 8.4 3.8 - 10.8 Thousand/uL   RBC 4.06 3.80 - 5.10 Million/uL   Hemoglobin 12.7 11.7 - 15.5 g/dL   HCT 38.2 35.0 - 45.0 %   MCV 94.1 80.0 - 100.0 fL   MCH 31.3 27.0 - 33.0 pg   MCHC 33.2 32.0 - 36.0 g/dL   RDW 11.6 11.0 - 15.0 %   Platelets 206 140 - 400  Thousand/uL   MPV 12.6 (H) 7.5 - 12.5 fL   Neutro Abs 3,847 1,500 - 7,800 cells/uL   Lymphs Abs 2,822 850 - 3,900 cells/uL   Absolute Monocytes 512 200 - 950 cells/uL   Eosinophils Absolute 1,159 (H) 15 - 500 cells/uL   Basophils Absolute 59 0 - 200 cells/uL   Neutrophils Relative % 45.8 %   Total Lymphocyte 33.6 %   Monocytes Relative 6.1 %   Eosinophils Relative 13.8 %   Basophils Relative 0.7 %   Comment      Comment: Absolute eosinophilia, the most common associations being allergic disorders, certain parasitic infections and Hodgkin's Disease. Myeloid population consists predominantly of mature segmented neutrophils with mild reactive changes. No immature cells are identified. Review of the peripheral smear reveals adequate numbers of platelets. Elliptocytes noted. Iron studies may be of use, if clinically indicated. Reviewed by Nehemiah MassedMarisa C. Mammarappallil, MD  (Electronic Signature on File)      07/20/2019   Ova and parasite examination     Status: None   Collection Time: 07/21/19  9:33 AM   Specimen: Stool  Result Value Ref Range   MICRO NUMBER: [id number]    SPECIMEN QUALITY: Adequate    Source STOOL    STATUS: FINAL    CONCENTRATE RESULT: No ova or parasites seen    TRICHROME RESULT: No ova or parasites seen    COMMENT:      Routine Ova and Parasite exam may not detect some parasites that occasionally cause diarrheal illness. Test code(s) 5409837213 (Cryptosporidium Ag., DFA) and/or 1191410018 (Cyclospora and Isospora Exam) may be ordered to detect  these parasites. One negative sample  does not necessarily rule out the presence of a parasitic infection.  For additional information, please refer to https://education.questdiagnostics.com/faq/FAQ203 (This link is being provided for informational/ educational purposes only.)       Assessment and Plan: 33 y.o. female with   .[place] was seen today for results.  Diagnoses and all orders for this visit:  Eosinophilia -     Ambulatory referral to Hematology  Recurrent fever of unknown etiology -     Ambulatory referral to Hematology  Migraine without aura and without status migrainosus, not intractable   Recurrent fever, Eosinophilia Patient afebrile today. Weight is stable. No adenopathy on exam Due to presence of constitutional symptoms will refer to Hematology for further eval of eosinophilia  Migraine - we discussed option of Proranolol for prophylaxis and she would like to defer for now - will avoid triptans due to planning for pregnancy - headache diary - counseled on general headache lifestyle measures   Patient education and anticipatory guidance given Patient agrees with treatment plan Follow-up as needed if symptoms worsen or fail to improve  Levonne Hubertharley E. Cummings PA-C

## 2019-07-27 ENCOUNTER — Encounter: Payer: Self-pay | Admitting: Physician Assistant

## 2019-07-27 ENCOUNTER — Encounter: Payer: Self-pay | Admitting: Gastroenterology

## 2019-07-27 ENCOUNTER — Other Ambulatory Visit: Payer: Self-pay

## 2019-07-27 ENCOUNTER — Ambulatory Visit: Payer: No Typology Code available for payment source | Admitting: Gastroenterology

## 2019-07-27 ENCOUNTER — Encounter

## 2019-07-27 VITALS — BP 106/70 | HR 67 | Temp 98.3°F | Ht 64.0 in | Wt 113.5 lb

## 2019-07-27 DIAGNOSIS — R110 Nausea: Secondary | ICD-10-CM | POA: Diagnosis not present

## 2019-07-27 DIAGNOSIS — R1013 Epigastric pain: Secondary | ICD-10-CM | POA: Diagnosis not present

## 2019-07-27 MED ORDER — ONDANSETRON 4 MG PO TBDP: 4.0000 mg | ORAL_TABLET | Freq: Four times a day (QID) | ORAL | 0 refills | Status: DC | PRN

## 2019-07-27 NOTE — Progress Notes (Signed)
Chief Complaint: Abdominal pain  Referring Provider:  Donzetta Kohutummings, Charley Eliza*      ASSESSMENT AND PLAN;   #1. Epigastric pain. Neg US 07/2019. D/d PUD, GERD, gastritis, nonulcer dyspepsia, gastroparesis, musculoskeletal etiology, r/o biliary or pancreatic problems. Nl CMP, lipase, TSH and HP breath test in past.   #2. Nausea with peripheral eosinophilia ?etiology. Getting better. Neg ANA in past  Plan: - EGD with SB Bx for further evaluation.  Need to r/o eosinophilic gastroenteritis. - Covid 19 IGM/IGG antibodies.  Could have had previous exposure. - Rpt CBC with diff in 4 weeks. - If still with problems, proceed with HIDA with EF followed by CT Abdo/pelvis.  If still with problems, will consider lower GI evaluation.  I have reassured the patient.  If there is increased eosinophil count, would consider allergy testing. - Zofran 4mg  ODT Q6hrs prn nausea for now #20.  HPI:    Dana Eaton is a 33 y.o. female  With intermittent epigastric pain -sharp, did wake her up, lasting 30-60 minutes, nonradiating, associated with nausea but no vomiting.  Did have another episode of abdominal pain while [place] the [place].  No definite triggering factors.  No significant heartburn.  She was given a trial of omeprazole for 2 weeks without any relief.  Sometimes a feeling of nausea would linger on.  No odynophagia or dysphagia.  No diarrhea or constipation.  No significant weight loss.  Had low-grade fever intermittently over the last 2 to 3 weeks.  Max 100.55F  Had fairly thorough evaluation performed by Rosita Keaharlie Cummings PA.  Negative ultrasound of the abdomen, CBC revealed increased eosinophilia with normal hemoglobin.  Stool studies were negative for ova parasites.  Repeat CBC with differential showed some improvement in eosinophil count. She had normal CMP, H. pylori breath test  Dana E[place] did visit [place] in [place].  Mostly stayed at the beach.  Did have  bug bites.  No significant environmental allergies.   31-year-old daughter had flulike symptoms previously.  Could have been COVID-19.  Dana Eaton does complain of "brain fog".  No change in taste or smell.   Past Medical History:  Diagnosis Date   Anemia     Past Surgical History:  Procedure Laterality Date   MANDIBLE SURGERY      Family History  Problem Relation Age of Onset   Osteoarthritis Maternal Grandmother    Osteoporosis Maternal Grandmother    Pancreatic cancer Maternal Grandmother [id number]   Seizures Father    Seizures Sister    Colon cancer Paternal Grandmother    Esophageal cancer Neg Hx     Social History   Tobacco Use   Smoking status: Never Smoker   Smokeless tobacco: Never Used   Tobacco comment: tried it once before-smoking   Substance Use Topics   Alcohol use: Yes    Alcohol/week: 0.0 standard drinks    Comment: Ocassionally   Drug use: No    Current Outpatient Medications  Medication Sig Dispense Refill   Lactobacillus (CULTURELLE DIGESTIVE WOMENS) CAPS Take 1 capsule by mouth daily.     Prenatal Vit-Fe Fumarate-FA (PRENATAL MULTIVITAMIN) TABS tablet Take 1 tablet by mouth daily at 12 noon.     No current facility-administered medications for this visit.     No Known Allergies  Review of Systems:  Constitutional: Denies fever, chills, diaphoresis, appetite change and has fatigue.  HEENT: Denies photophobia, eye pain, redness, hearing loss, ear pain, congestion, sore throat, rhinorrhea, sneezing, mouth sores, neck pain,  neck stiffness and tinnitus.   Respiratory: Denies SOB, DOE, cough, chest tightness,  and wheezing.   Cardiovascular: Denies chest pain, palpitations and leg swelling.  Genitourinary: Denies dysuria, urgency, frequency, hematuria, flank pain and difficulty urinating.  Musculoskeletal: Denies myalgias, back pain, joint swelling, arthralgias and gait problem.  Skin: No rash.  Neurological: Denies dizziness, seizures,  syncope, weakness, light-headedness, numbness and has headaches.  Hematological: Denies adenopathy. Easy bruising, personal or family bleeding history  Psychiatric/Behavioral: Has situational anxiety.    Physical Exam:    BP 106/70    Pulse 67    Temp 98.3 F (36.8 C)    Ht 5\' 4"  (1.626 m)    Wt 113 lb 8 oz (51.5 kg)    LMP 07/13/2019    BMI 19.48 kg/m  Filed Weights   07/27/19 1351  Weight: 113 lb 8 oz (51.5 kg)   Constitutional:  Well-developed, in no acute distress. Psychiatric: Normal mood and affect. Behavior is normal. HEENT: Pupils normal.  Conjunctivae are normal. No scleral icterus. Neck supple.  Cardiovascular: Normal rate, regular rhythm. No edema Pulmonary/chest: Effort normal and breath sounds normal. No wheezing, rales or rhonchi. Abdominal: Soft, nondistended. Nontender. Bowel sounds active throughout. There are no masses palpable. No hepatomegaly.  Small umbilical hernia. Rectal:  defered Neurological: Alert and oriented to person place and time. Skin: Skin is warm and dry. No rashes noted.  Data Reviewed: I have personally reviewed following labs and imaging studies  CBC: CBC Latest Ref Rng & Units 07/17/2019 07/10/2019 05/02/2018  WBC 3.8 - 10.8 Thousand/uL 8.4 8.8 5.8  Hemoglobin 11.7 - 15.5 g/dL 16.112.7 09.612.7 04.512.8  Hematocrit 35.0 - 45.0 % 38.2 37.9 36.9  Platelets 140 - 400 Thousand/uL 206 185 225    CMP: CMP Latest Ref Rng & Units 07/10/2019 05/02/2018 06/07/2017  Glucose 65 - 99 mg/dL 84 82 79  BUN 7 - 25 mg/dL 16 14 11   Creatinine 0.50 - 1.10 mg/dL 4.090.68 8.110.71 9.140.63  Sodium 135 - 146 mmol/L 137 139 137  Potassium 3.5 - 5.3 mmol/L 4.2 4.1 4.0  Chloride 98 - 110 mmol/L 103 104 101  CO2 20 - 32 mmol/L 28 28 26   Calcium 8.6 - 10.2 mg/dL 9.2 9.3 8.7  Total Protein 6.1 - 8.1 g/dL 6.9 6.6 6.3  Total Bilirubin 0.2 - 1.2 mg/dL 0.5 0.5 0.4  Alkaline Phos 33 - 115 U/L - - 76  AST 10 - 30 U/L 13 11 13   ALT 6 - 29 U/L 14 11 13     GFR: Estimated Creatinine Clearance:  82.8 mL/min (by C-G formula based on SCr of 0.68 mg/dL). Liver Function Tests:   Recent Results (from the past 240 hour(s))  Ova and parasite examination     Status: None   Collection Time: 07/21/19  9:33 AM   Specimen: Stool  Result Value Ref Range Status   MICRO NUMBER: [id number]  Final   SPECIMEN QUALITY: Adequate  Final   Source STOOL  Final   STATUS: FINAL  Final   CONCENTRATE RESULT: No ova or parasites seen  Final   TRICHROME RESULT: No ova or parasites seen  Final   COMMENT:   Final    Routine Ova and Parasite exam may not detect some parasites that occasionally cause diarrheal illness. Test code(s) 3086537213 (Cryptosporidium Ag., DFA) and/or 7846910018 (Cyclospora and Isospora Exam) may be ordered to detect these parasites. One negative sample  does not necessarily rule out the presence of a parasitic infection.  For additional  information, please refer to https://education.questdiagn[facility].com/faq/FAQ203 (This link is being provided for informational/ educational purposes only.)       Radiology Studies: US Abdomen Limited Ruq  Result Date: 07/16/2019 CLINICAL DATA:  Abdominal pain with nausea EXAM: ULTRASOUND ABDOMEN LIMITED RIGHT UPPER QUADRANT COMPARISON:  None. FINDINGS: Gallbladder: No gallstones or wall thickening visualized. There is no pericholecystic fluid. No sonographic Murphy sign noted by sonographer. Common bile duct: Diameter: 4 mm. No intrahepatic or extrahepatic biliary duct dilatation. Liver: No focal lesion identified. Within normal limits in parenchymal echogenicity. Portal vein is patent on color Doppler imaging with normal direction of blood flow towards the liver. Other: None. IMPRESSION: Study within normal limits. Electronically Signed   By: Lowella Grip III M.D.   On: 07/16/2019 13:43      Carmell Austria, MD 07/27/2019, 2:00 PM  Cc: Ottis Stain*

## 2019-07-27 NOTE — Patient Instructions (Addendum)
If you are age 31 or older, your body mass index should be between 23-30. Your Body mass index is 19.48 kg/m. If this is out of the aforementioned range listed, please consider follow up with your Primary Care Provider.  If you are age 37 or younger, your body mass index should be between 19-25. Your Body mass index is 19.48 kg/m. If this is out of the aformentioned range listed, please consider follow up with your Primary Care Provider.   Please go to the lab at [facility] ([employer] You will need to go to level "B", you do not need an appointment for this. Hours available are 7:30 am - 4:30 pm.   Also need to have CBC (labs) drawn in 4 weeks.   You have been scheduled for an endoscopy. Please follow written instructions given to you at your visit today. If you use inhalers (even only as needed), please bring them with you on the day of your procedure. Your physician has requested that you go to www.startemmi.com and enter the access code given to you at your visit today. This web site gives a general overview about your procedure. However, you should still follow specific instructions given to you by our office regarding your preparation for the procedure.  We have sent the following medications to your pharmacy for you to pick up at your convenience: Zofran  Thank you,  Dr. Jackquline Denmark

## 2019-07-28 ENCOUNTER — Encounter: Payer: Self-pay | Admitting: Physician Assistant

## 2019-07-28 DIAGNOSIS — G43009 Migraine without aura, not intractable, without status migrainosus: Secondary | ICD-10-CM | POA: Insufficient documentation

## 2019-07-28 DIAGNOSIS — R509 Fever, unspecified: Secondary | ICD-10-CM | POA: Insufficient documentation

## 2019-07-29 ENCOUNTER — Telehealth: Payer: Self-pay | Admitting: Hematology & Oncology

## 2019-07-29 LAB — GASTROINTESTINAL PATHOGEN PANEL PCR
C. difficile Tox A/B, PCR: NOT DETECTED
Campylobacter, PCR: NOT DETECTED
Cryptosporidium, PCR: NOT DETECTED
E coli (ETEC) LT/ST PCR: NOT DETECTED
E coli (STEC) stx1/stx2, PCR: NOT DETECTED
E coli 0157, PCR: NOT DETECTED
Giardia lamblia, PCR: DETECTED — AB
Norovirus, PCR: NOT DETECTED
Rotavirus A, PCR: NOT DETECTED
Salmonella, PCR: NOT DETECTED
Shigella, PCR: NOT DETECTED

## 2019-07-29 LAB — OVA AND PARASITE EXAMINATION
CONCENTRATE RESULT:: NONE SEEN
MICRO NUMBER:: 784560
SPECIMEN QUALITY:: ADEQUATE
TRICHROME RESULT:: NONE SEEN

## 2019-07-29 NOTE — Telephone Encounter (Signed)
Called and spoke with patient regarding New Patient appointment date/time/location/phone number .  Calendar was mailed as she requessted

## 2019-07-30 ENCOUNTER — Encounter: Payer: Self-pay | Admitting: Gastroenterology

## 2019-07-30 MED ORDER — TINIDAZOLE 500 MG PO TABS: 2.0000 g | ORAL_TABLET | Freq: Once | ORAL | 0 refills | Status: AC

## 2019-07-30 NOTE — Addendum Note (Signed)
Addended by: Nelson Chimes E on: 07/30/2019 12:16 PM   Modules accepted: Orders

## 2019-07-31 ENCOUNTER — Encounter: Payer: Self-pay | Admitting: Physician Assistant

## 2019-07-31 ENCOUNTER — Telehealth: Payer: Self-pay | Admitting: Gastroenterology

## 2019-07-31 NOTE — Telephone Encounter (Signed)
Should proceed with EGD RG

## 2019-07-31 NOTE — Telephone Encounter (Signed)
Patient is scheduled for an EGD on 08/05/2019 with Dr. Lyndel Safe -please review previous message and advise

## 2019-08-03 ENCOUNTER — Encounter: Payer: Self-pay | Admitting: Physician Assistant

## 2019-08-03 ENCOUNTER — Telehealth: Payer: Self-pay | Admitting: *Deleted

## 2019-08-03 ENCOUNTER — Encounter: Payer: No Typology Code available for payment source | Admitting: Gastroenterology

## 2019-08-03 ENCOUNTER — Telehealth (INDEPENDENT_AMBULATORY_CARE_PROVIDER_SITE_OTHER): Payer: No Typology Code available for payment source | Admitting: Physician Assistant

## 2019-08-03 VITALS — Temp 99.6°F | Wt 112.0 lb

## 2019-08-03 DIAGNOSIS — A071 Giardiasis [lambliasis]: Secondary | ICD-10-CM

## 2019-08-03 DIAGNOSIS — K429 Umbilical hernia without obstruction or gangrene: Secondary | ICD-10-CM

## 2019-08-03 NOTE — Progress Notes (Signed)
Virtual Visit via Telephone Note  I connected with Dana Eaton on 08/03/19 at  3:00 PM EDT by a telephone and verified that I am speaking with the correct person using two identifiers.   I discussed the limitations of evaluation and management by telemedicine and the availability of in person appointments. The patient expressed understanding and agreed to proceed.  History of Present Illness: HPI:                                                                Dana Eaton is a 33 y.o. 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 She was treated with single-dose of Tinidazole on 07/31/19. Reports she felt a little ill the following day, but did not have any vomiting.    Past Medical History:  Diagnosis Date  . Anemia    Past Surgical History:  Procedure Laterality Date  . MANDIBLE SURGERY     Social History   Tobacco Use  . Smoking status: Never Smoker  . Smokeless tobacco: Never Used  . Tobacco comment: tried it once before-smoking   Substance Use Topics  . Alcohol use: Yes    Alcohol/week: 0.0 standard drinks    Comment: Ocassionally   family history includes Colon cancer in her paternal grandmother; Osteoarthritis in her maternal grandmother; Osteoporosis in her maternal grandmother; Pancreatic cancer (age of onset: 9) in her maternal grandmother; Seizures in her father and sister.    ROS: negative except as noted in the HPI  Medications: Current Outpatient Medications  Medication Sig Dispense Refill  . Lactobacillus (CULTURELLE DIGESTIVE WOMENS) CAPS Take 1 capsule by mouth daily.    . ondansetron (ZOFRAN ODT) 4 MG disintegrating tablet Take 1 tablet (4 mg total) by mouth every 6 (six) hours as needed for nausea or vomiting. 20 tablet 0  . Prenatal Vit-Fe Fumarate-FA (PRENATAL MULTIVITAMIN) TABS tablet Take 1 tablet by mouth daily at 12 noon.     No current facility-administered medications for  this visit.    No Known Allergies     Objective:  Temp 99.6 F (37.6 C) (Oral)   Wt 112 lb (50.8 kg)   LMP 07/13/2019   BMI 19.22 kg/m     Recent Results (from the past 2160 hour(s))  POCT urine pregnancy     Status: Normal   Collection Time: 07/10/19 10:12 AM  Result Value Ref Range   Preg Test, Ur Negative Negative  POCT Urinalysis Dipstick     Status: Normal   Collection Time: 07/10/19 10:22 AM  Result Value Ref Range   Color, UA yellow    Clarity, UA clear    Glucose, UA Negative Negative   Bilirubin, UA negative    Ketones, UA negative    Spec Grav, UA 1.010 1.010 - 1.025   Blood, UA negative    pH, UA 7.0 5.0 - 8.0   Protein, UA Negative Negative   Urobilinogen, UA 0.2 0.2 or 1.0 E.U./dL   Nitrite, UA negative    Leukocytes, UA Negative Negative   Appearance     Odor    COMPLETE METABOLIC PANEL WITH GFR     Status: None   Collection Time: 07/10/19 11:06 AM  Result Value Ref Range  Glucose, Bld 84 65 - 99 mg/dL    Comment: .            Fasting reference interval .    BUN 16 7 - 25 mg/dL   Creat 0.68 0.50 - 1.10 mg/dL   GFR, Est Non African American 117 > OR = 60 mL/min/1.38m   GFR, Est African American 135 > OR = 60 mL/min/1.730m  BUN/Creatinine Ratio NOT APPLICABLE 6 - 22 (calc)   Sodium 137 135 - 146 mmol/L   Potassium 4.2 3.5 - 5.3 mmol/L   Chloride 103 98 - 110 mmol/L   CO2 28 20 - 32 mmol/L   Calcium 9.2 8.6 - 10.2 mg/dL   Total Protein 6.9 6.1 - 8.1 g/dL   Albumin 4.5 3.6 - 5.1 g/dL   Globulin 2.4 1.9 - 3.7 g/dL (calc)   AG Ratio 1.9 1.0 - 2.5 (calc)   Total Bilirubin 0.5 0.2 - 1.2 mg/dL   Alkaline phosphatase (APISO) 39 31 - 125 U/L   AST 13 10 - 30 U/L   ALT 14 6 - 29 U/L  Lipase     Status: None   Collection Time: 07/10/19 11:06 AM  Result Value Ref Range   Lipase 32 7 - 60 U/L  CBC with Differential/Platelet     Status: Abnormal   Collection Time: 07/10/19 11:06 AM  Result Value Ref Range   WBC 8.8 3.8 - 10.8 Thousand/uL   RBC  4.07 3.80 - 5.10 Million/uL   Hemoglobin 12.7 11.7 - 15.5 g/dL   HCT 37.9 35.0 - 45.0 %   MCV 93.1 80.0 - 100.0 fL   MCH 31.2 27.0 - 33.0 pg   MCHC 33.5 32.0 - 36.0 g/dL   RDW 11.6 11.0 - 15.0 %   Platelets 185 140 - 400 Thousand/uL   MPV 12.8 (H) 7.5 - 12.5 fL   Neutro Abs 4,426 1,500 - 7,800 cells/uL   Lymphs Abs 2,385 850 - 3,900 cells/uL   Absolute Monocytes 466 200 - 950 cells/uL   Eosinophils Absolute 1,496 (H) 15 - 500 cells/uL   Basophils Absolute 26 0 - 200 cells/uL   Neutrophils Relative % 50.3 %   Total Lymphocyte 27.1 %   Monocytes Relative 5.3 %   Eosinophils Relative 17.0 %   Basophils Relative 0.3 %  TSH + free T4     Status: None   Collection Time: 07/10/19 11:10 AM  Result Value Ref Range   TSH W/REFLEX TO FT4 0.78 mIU/L    Comment:           Reference Range .           > or = 20 Years  0.40-4.50 .                Pregnancy Ranges           First trimester    0.26-2.66           Second trimester   0.55-2.73           Third trimester    0.43-2.91   B-HCG Quant     Status: None   Collection Time: 07/10/19 11:13 AM  Result Value Ref Range   HCG, Total, QN <3 mIU/mL    Comment: Gestational Age   Expected hCG values (mIU/mL) <1 Week:                 5-50 1-2 Weeks:  50-500 2-3 Weeks:               [id number] 3-4 Weeks:               500-10000 4-5 Weeks:               1000-50000 5-6 Weeks:               10000-100000 6-8 Weeks:               15000-200000 2-3 Months:              10000-100000 The table above provides only a very rough estimate of gestational age and should be used only in conjunction with other methods for establishing gestational age. Much more reliable and accurate estimations of gestational age may be obtained by using LMP or ultrasound. . . Values from different assay methods may vary. The use of this assay to monitor or to diagnose  patients with cancer or any condition unrelated to pregnancy has not been cleared or  approved by the FDA or the manufacturer of the assay.   CBC (INCLUDES DIFF/PLT) WITH PATHOLOGIST REVIEW     Status: Abnormal   Collection Time: 07/17/19  3:32 PM  Result Value Ref Range   WBC 8.4 3.8 - 10.8 Thousand/uL   RBC 4.06 3.80 - 5.10 Million/uL   Hemoglobin 12.7 11.7 - 15.5 g/dL   HCT 38.2 35.0 - 45.0 %   MCV 94.1 80.0 - 100.0 fL   MCH 31.3 27.0 - 33.0 pg   MCHC 33.2 32.0 - 36.0 g/dL   RDW 11.6 11.0 - 15.0 %   Platelets 206 140 - 400 Thousand/uL   MPV 12.6 (H) 7.5 - 12.5 fL   Neutro Abs 3,847 1,500 - 7,800 cells/uL   Lymphs Abs 2,822 850 - 3,900 cells/uL   Absolute Monocytes 512 200 - 950 cells/uL   Eosinophils Absolute 1,159 (H) 15 - 500 cells/uL   Basophils Absolute 59 0 - 200 cells/uL   Neutrophils Relative % 45.8 %   Total Lymphocyte 33.6 %   Monocytes Relative 6.1 %   Eosinophils Relative 13.8 %   Basophils Relative 0.7 %   Comment      Comment: Absolute eosinophilia, the most common associations being allergic disorders, certain parasitic infections and Hodgkin's Disease. Myeloid population consists predominantly of mature segmented neutrophils with mild reactive changes. No immature cells are identified. Review of the peripheral smear reveals adequate numbers of platelets. Elliptocytes noted. Iron studies may be of use, if clinically indicated. Reviewed by Francis Gaines Mammarappallil, MD  (Electronic Signature on File)      07/20/2019   Ova and parasite examination     Status: None   Collection Time: 07/21/19  9:33 AM   Specimen: Stool  Result Value Ref Range   MICRO NUMBER: [id number]    SPECIMEN QUALITY: Adequate    Source STOOL    STATUS: FINAL    CONCENTRATE RESULT: No ova or parasites seen    TRICHROME RESULT: No ova or parasites seen    COMMENT:      Routine Ova and Parasite exam may not detect some parasites that occasionally cause diarrheal illness. Test code(s) 85027 (Cryptosporidium Ag., DFA) and/or 10018 (Cyclospora and Isospora Exam) may be  ordered to detect these parasites. One negative sample  does not necessarily rule out the presence of a parasitic infection.  For additional information, please refer to https://education.[facility].com/faq/FAQ203 (This link is being provided for informational/ educational purposes  only.)   Gastrointestinal Pathogen Panel PCR     Status: Abnormal   Collection Time: 07/21/19  9:33 AM  Result Value Ref Range   Campylobacter, PCR NOT DETECTED NOT DETECT   C. difficile Tox A/B, PCR NOT DETECTED NOT DETECT   E coli 0157, PCR NOT DETECTED NOT DETECT   E coli (ETEC) LT/ST PCR NOT DETECTED NOT DETECT   E coli (STEC) stx1/stx2, PCR NOT DETECTED NOT DETECT   Salmonella, PCR NOT DETECTED NOT DETECT   Shigella, PCR NOT DETECTED NOT DETECT   Norovirus, PCR NOT DETECTED NOT DETECT   Rotavirus A, PCR NOT DETECTED NOT DETECT   Giardia lamblia, PCR DETECTED (A) NOT DETECT   Cryptosporidium, PCR NOT DETECTED NOT DETECT    Comment: . The xTAG(R) Gastrointestinal Pathogen Panel results are presumptive and must be confirmed by FDA-cleared tests or other acceptable reference methods. The results of this test should not be used as the sole basis for diagnosis, treatment, or other patient management decisions. . Performed using the [employer] xTAG(R) Gastrointestinal Pathogen Panel test kit.       Assessment and Plan: 33 y.o. female with   .Diagnoses and all orders for this visit:  Giardiasis (lambliasis) -     Giardia Antigen (Stool); Future   Completed Tinidazole on 07/31/19  Discussed etiology and transmission of Giardia Return in approx 3 weeks (no sooner than) for repeat Giardia antigen and CBC to ensure successful treatment and resolution of Eosinophilia Cont daily temperature log to ensure resolution of fevers Recommended she proceed with EGD as scheduled in case her symptoms are multifactorial and to r/o PUD/esophagitis/GERD/hiatal hernia Encouraged family members to f/u with their  PCP regarding testing for Giardiasis   Follow Up Instructions:    I discussed the assessment and treatment plan with the patient. The patient was provided an opportunity to ask questions and all were answered. The patient agreed with the plan and demonstrated an understanding of the instructions.   The patient was advised to call back or seek an in-person evaluation if the symptoms worsen or if the condition fails to improve as anticipated.  I provided 10 minutes of non-face-to-face time during this encounter.   Trixie Dredge, [place]

## 2019-08-03 NOTE — Telephone Encounter (Signed)
Patient returned call to the office-patient informed of MD recommendation to proceed with EGD-patient verbalized understanding of information/instructions; patient advised to call back to the office should questions/concerns arise;

## 2019-08-03 NOTE — Telephone Encounter (Signed)
Left message for patient to call back to the office;  

## 2019-08-03 NOTE — Telephone Encounter (Signed)
Pt called stating that she had recently been seen at her GI DR and was told that she has a Umbilical hernia.  She is concerned about that and trying to get pregnant again.  Spoke with Dr Gala Romney who recommended a referral to a general surgeon for evaluation.  Referral placed.

## 2019-08-04 ENCOUNTER — Telehealth: Payer: Self-pay

## 2019-08-04 NOTE — Telephone Encounter (Signed)
Pt returned call and said she has been having a "low grade temp" and it may be because of a parasite

## 2019-08-04 NOTE — Telephone Encounter (Signed)
Covid-19 screening questions   Do you now or have you had a fever in the last 14 days?  Do you have any respiratory symptoms of shortness of breath or cough now or in the last 14 days?  Do you have any family members or close contacts with diagnosed or suspected Covid-19 in the past 14 days?  Have you been tested for Covid-19 and found to be positive?       

## 2019-08-05 ENCOUNTER — Telehealth: Payer: Self-pay

## 2019-08-05 ENCOUNTER — Encounter: Payer: Self-pay | Admitting: Gastroenterology

## 2019-08-05 ENCOUNTER — Ambulatory Visit (AMBULATORY_SURGERY_CENTER): Payer: No Typology Code available for payment source | Admitting: Gastroenterology

## 2019-08-05 ENCOUNTER — Other Ambulatory Visit: Payer: Self-pay

## 2019-08-05 VITALS — BP 108/57 | HR 58 | Temp 98.2°F | Resp 10 | Ht 64.0 in | Wt 113.0 lb

## 2019-08-05 DIAGNOSIS — R1013 Epigastric pain: Secondary | ICD-10-CM

## 2019-08-05 MED ORDER — SODIUM CHLORIDE 0.9 % IV SOLN: 500.0000 mL | Freq: Once | INTRAVENOUS | Status: DC

## 2019-08-05 NOTE — Telephone Encounter (Signed)
Covid-19 screening questions   Do you now or have you had a fever in the last 14 days? Has had a off/on very low grade temp, currently 98.9. patient believes its due to a parasite that she has.   Do you have any respiratory symptoms of shortness of breath or cough now or in the last 14 days? No  Do you have any family members or close contacts with diagnosed or suspected Covid-19 in the past 14 days? No.  Have you been tested for Covid-19 and found to be positive? No.

## 2019-08-05 NOTE — Patient Instructions (Signed)
YOU HAD AN ENDOSCOPIC PROCEDURE TODAY AT THE [facility]:   Refer to the procedure report that was given to you for any specific questions about what was found during the examination.  If the procedure report does not answer your questions, please call your gastroenterologist to clarify.  If you requested that your care partner not be given the details of your procedure findings, then the procedure report has been included in a sealed envelope for you to review at your convenience later.  YOU SHOULD EXPECT: Some feelings of bloating in the abdomen. Passage of more gas than usual.  Walking can help get rid of the air that was put into your GI tract during the procedure and reduce the bloating. If you had a lower endoscopy (such as a colonoscopy or flexible sigmoidoscopy) you may notice spotting of blood in your stool or on the toilet paper. If you underwent a bowel prep for your procedure, you may not have a normal bowel movement for a few days.  Please Note:  You might notice some irritation and congestion in your nose or some drainage.  This is from the oxygen used during your procedure.  There is no need for concern and it should clear up in a day or so.  SYMPTOMS TO REPORT IMMEDIATELY:   Following upper endoscopy (EGD)  Vomiting of blood or coffee ground material  New chest pain or pain under the shoulder blades  Painful or persistently difficult swallowing  New shortness of breath  Fever of 100F or higher  Black, tarry-looking stools  For urgent or emergent issues, a gastroenterologist can be reached at any hour by calling [phone].   DIET:  We do recommend a small meal at first, but then you may proceed to your regular diet.  Drink plenty of fluids but you should avoid alcoholic beverages for 24 hours.  ACTIVITY:  You should plan to take it easy for the rest of today and you should NOT DRIVE or use heavy machinery until tomorrow (because of the sedation medicines used  during the test).    FOLLOW UP: Our staff will call the number listed on your records 48-72 hours following your procedure to check on you and address any questions or concerns that you may have regarding the information given to you following your procedure. If we do not reach you, we will leave a message.  We will attempt to reach you two times.  During this call, we will ask if you have developed any symptoms of COVID 19. If you develop any symptoms (ie: fever, flu-like symptoms, shortness of breath, cough etc.) before then, please call [phone].  If you test positive for Covid 19 in the 2 weeks post procedure, please call and report this information to us.    If any biopsies were taken you will be contacted by phone or by letter within the next 1-3 weeks.  Please call us at [phone] if you have not heard about the biopsies in 3 weeks.    SIGNATURES/CONFIDENTIALITY: You and/or your care partner have signed paperwork which will be entered into your electronic medical record.  These signatures attest to the fact that that the information above on your After Visit Summary has been reviewed and is understood.  Full responsibility of the confidentiality of this discharge information lies with you and/or your care-partner. 

## 2019-08-05 NOTE — Telephone Encounter (Signed)
Left message regarding low grade fever, will await patients call back

## 2019-08-05 NOTE — Progress Notes (Signed)
To PACU VSS. Report to RN.tb 

## 2019-08-05 NOTE — Progress Notes (Signed)
Temperature taken by S.P., VS taken by C.W. 

## 2019-08-05 NOTE — Op Note (Signed)
[facility] Patient Name: Dana Eaton Procedure Date: 08/05/2019 10:52 AM MRN: [id number] Endoscopist: Jackquline Denmark , MD Age: 33 Referring MD:  Date of Birth: [birth year] Gender: Female Account #: [id number] Procedure:                Upper GI endoscopy Indications:              #1. Epigastric pain. Neg [place] 07/2019.                           #2. Nausea with peripheral eosinophilia                           #3. Recent H/O giardiasis s/p treatment with                            tinidazole. Medicines:                Monitored Anesthesia Care Procedure:                Pre-Anesthesia Assessment:                           - Prior to the procedure, a History and Physical                            was performed, and patient medications and                            allergies were reviewed. The patient's tolerance of                            previous anesthesia was also reviewed. The risks                            and benefits of the procedure and the sedation                            options and risks were discussed with the patient.                            All questions were answered, and informed consent                            was obtained. Prior Anticoagulants: The patient has                            taken no previous anticoagulant or antiplatelet                            agents. ASA Grade Assessment: I - A normal, healthy                            patient. After reviewing the risks and benefits,  the patient was deemed in satisfactory condition to                            undergo the procedure.                           After obtaining informed consent, the endoscope was                            passed under direct vision. Throughout the                            procedure, the patient's blood pressure, pulse, and                            oxygen saturations were monitored continuously. The   Endoscope was introduced through the mouth, and                            advanced to the second part of duodenum. The upper                            GI endoscopy was accomplished without difficulty.                            The patient tolerated the procedure well. Scope In: Scope Out: Findings:                 The examined esophagus was normal.                           The Z-line was regular and was found 38 cm from the                            incisors.                           The entire examined stomach was normal. Biopsies                            were taken with a cold forceps for histology.                            Estimated blood loss: none.                           The examined duodenum was normal. Biopsies were                            taken with a cold forceps for histology. Estimated                            blood loss: none. Complications:            No immediate complications. Estimated Blood Loss:     Estimated blood loss: none. Impression:               -  Normal EGD. Recommendation:           - Patient has a contact number available for                            emergencies. The signs and symptoms of potential                            delayed complications were discussed with the                            patient. Return to normal activities tomorrow.                            Written discharge instructions were provided to the                            patient.                           - Resume previous diet.                           - Continue present medications.                           - Await pathology results.                           - Return to GI clinic in 12 weeks.                           - D/W Arlys JohnBrian (pt's husband). Lynann Bolognaajesh Aleayah [place], MD 08/05/2019 11:29:56 AM This report has been signed electronically.

## 2019-08-05 NOTE — Progress Notes (Signed)
Called to room to assist during endoscopic procedure.  Patient ID and intended procedure confirmed with present staff. Received instructions for my participation in the procedure from the performing physician.  

## 2019-08-06 ENCOUNTER — Ambulatory Visit (INDEPENDENT_AMBULATORY_CARE_PROVIDER_SITE_OTHER): Payer: No Typology Code available for payment source | Admitting: Physician Assistant

## 2019-08-06 ENCOUNTER — Encounter: Payer: Self-pay | Admitting: Physician Assistant

## 2019-08-06 VITALS — BP 113/72 | HR 61 | Temp 98.8°F | Wt 116.0 lb

## 2019-08-06 DIAGNOSIS — R1013 Epigastric pain: Secondary | ICD-10-CM

## 2019-08-06 DIAGNOSIS — R110 Nausea: Secondary | ICD-10-CM

## 2019-08-06 DIAGNOSIS — A071 Giardiasis [lambliasis]: Secondary | ICD-10-CM | POA: Diagnosis not present

## 2019-08-06 DIAGNOSIS — D721 Eosinophilia, unspecified: Secondary | ICD-10-CM

## 2019-08-06 NOTE — Progress Notes (Signed)
HPI:                                                                Dana Eaton is a 33 y.o. female who presents to  [facility] today for   Patient has been struggling with recurrent fevers and abdominal pain for months. She was found to have mild eosinophilia and giardia lamblia PCR positive. She was treated with single-dose of Tinidazole on 07/31/19. Reports she felt a little ill the following 2 days with headache and nausea, but did not have any vomiting. She states she has not had a headache for the last 4 days.   She also reports that her oral thermometer still shows temperature of 100, but when she compares it to a tympanic thermometer her temperature is normal. She has never had fevers in the office.  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 She was referred to Gen Surg by GI.  She underwent EGD yesterday, which was normal. Biopsies are pending.  She wants to know if it is okay to drink coffee and also if she should do periodic "cleanses."  Depression screen PHQ 2/9 08/06/2019 07/23/2019 05/02/2018  Decreased Interest 1 1 0  Down, Depressed, Hopeless 1 1 0  PHQ - 2 Score 2 2 0  Altered sleeping 1 1 -  Tired, decreased energy 1 1 -  Change in appetite 1 1 -  Feeling bad or failure about yourself  1 1 -  Trouble concentrating 1 2 -  Moving slowly or fidgety/restless 0 1 -  Suicidal thoughts 0 0 -  PHQ-9 Score 7 9 -    GAD 7 : Generalized Anxiety Score 08/06/2019 07/23/2019  Nervous, Anxious, on Edge 1 2  Control/stop worrying 1 1  Worry too much - different things 1 2  Trouble relaxing 1 2  Restless 0 1  Easily annoyed or irritable 1 3  Afraid - awful might happen 1 1  Total GAD 7 Score 6 12      Past Medical History:  Diagnosis Date  . Anemia    Past Surgical History:  Procedure Laterality Date  . MANDIBLE  SURGERY     Social History   Tobacco Use  . Smoking status: Never Smoker  . Smokeless tobacco: Never Used  . Tobacco comment: tried it once before-smoking   Substance Use Topics  . Alcohol use: Yes    Alcohol/week: 0.0 standard drinks    Comment: Ocassionally   family history includes Colon cancer in her paternal grandmother; Osteoarthritis in her maternal grandmother; Osteoporosis in her maternal grandmother; Pancreatic cancer (age of onset: 76) in her maternal grandmother; Seizures in her father and sister.    ROS: negative except as noted in the HPI  Medications: Current Outpatient Medications  Medication Sig Dispense Refill  . Lactobacillus (CULTURELLE DIGESTIVE WOMENS) CAPS Take 1 capsule by mouth daily.    . ondansetron (ZOFRAN ODT) 4 MG disintegrating tablet Take 1 tablet (4 mg total) by mouth every 6 (six) hours as needed for nausea or vomiting. 20 tablet 0  . Prenatal Vit-Fe Fumarate-FA (PRENATAL MULTIVITAMIN) TABS tablet Take 1 tablet by mouth   daily at 12 noon.     No current facility-administered medications for this visit.    No Known Allergies     Objective:  BP 113/72   Pulse 61   Temp 98.8 F (37.1 C) (Oral)   Wt 116 lb (52.6 kg)   LMP 07/13/2019   BMI 19.91 kg/m   Wt Readings from Last 3 Encounters:  08/06/19 116 lb (52.6 kg)  08/05/19 113 lb (51.3 kg)  08/03/19 112 lb (50.8 kg)   Temp Readings from Last 3 Encounters:  08/06/19 98.8 F (37.1 C) (Oral)  08/05/19 98.2 F (36.8 C)  08/03/19 99.6 F (37.6 C) (Oral)   BP Readings from Last 3 Encounters:  08/06/19 113/72  08/05/19 (!) 108/57  07/27/19 106/70   Pulse Readings from Last 3 Encounters:  08/06/19 61  08/05/19 (!) 58  07/27/19 67    Gen:  alert, not ill-appearing, no distress, appropriate for age 59: head normocephalic without obvious abnormality, conjunctiva and cornea clear, trachea midline Pulm: Normal work of breathing, normal phonation Neuro: alert and oriented x 3, no  tremor MSK: extremities atraumatic, normal gait and station Skin: intact, no rashes on exposed skin, no jaundice, no cyanosis Psych: well-groomed, cooperative, good eye contact, euthymic mood, affect mood-congruent, speech is articulate, and thought processes clear and goal-directed   Recent Results (from the past 2160 hour(s))  POCT urine pregnancy     Status: Normal   Collection Time: 07/10/19 10:12 AM  Result Value Ref Range   Preg Test, Ur Negative Negative  POCT Urinalysis Dipstick     Status: Normal   Collection Time: 07/10/19 10:22 AM  Result Value Ref Range   Color, UA yellow    Clarity, UA clear    Glucose, UA Negative Negative   Bilirubin, UA negative    Ketones, UA negative    Spec Grav, UA 1.010 1.010 - 1.025   Blood, UA negative    pH, UA 7.0 5.0 - 8.0   Protein, UA Negative Negative   Urobilinogen, UA 0.2 0.2 or 1.0 E.U./dL   Nitrite, UA negative    Leukocytes, UA Negative Negative   Appearance     Odor    COMPLETE METABOLIC PANEL WITH GFR     Status: None   Collection Time: 07/10/19 11:06 AM  Result Value Ref Range   Glucose, Bld 84 65 - 99 mg/dL    Comment: .            Fasting reference interval .    BUN 16 7 - 25 mg/dL   Creat 0.68 0.50 - 1.10 mg/dL   GFR, Est Non African American 117 > OR = 60 mL/min/1.54m   GFR, Est African American 135 > OR = 60 mL/min/1.766m  BUN/Creatinine Ratio NOT APPLICABLE 6 - 22 (calc)   Sodium 137 135 - 146 mmol/L   Potassium 4.2 3.5 - 5.3 mmol/L   Chloride 103 98 - 110 mmol/L   CO2 28 20 - 32 mmol/L   Calcium 9.2 8.6 - 10.2 mg/dL   Total Protein 6.9 6.1 - 8.1 g/dL   Albumin 4.5 3.6 - 5.1 g/dL   Globulin 2.4 1.9 - 3.7 g/dL (calc)   AG Ratio 1.9 1.0 - 2.5 (calc)   Total Bilirubin 0.5 0.2 - 1.2 mg/dL   Alkaline phosphatase (APISO) 39 31 - 125 U/L   AST 13 10 - 30 U/L   ALT 14 6 - 29 U/L  Lipase     Status: None  Collection Time: 07/10/19 11:06 AM  Result Value Ref Range   Lipase 32 7 - 60 U/L  CBC with  Differential/Platelet     Status: Abnormal   Collection Time: 07/10/19 11:06 AM  Result Value Ref Range   WBC 8.8 3.8 - 10.8 Thousand/uL   RBC 4.07 3.80 - 5.10 Million/uL   Hemoglobin 12.7 11.7 - 15.5 g/dL   HCT 37.9 35.0 - 45.0 %   MCV 93.1 80.0 - 100.0 fL   MCH 31.2 27.0 - 33.0 pg   MCHC 33.5 32.0 - 36.0 g/dL   RDW 11.6 11.0 - 15.0 %   Platelets 185 140 - 400 Thousand/uL   MPV 12.8 (H) 7.5 - 12.5 fL   Neutro Abs 4,426 1,500 - 7,800 cells/uL   Lymphs Abs 2,385 850 - 3,900 cells/uL   Absolute Monocytes 466 200 - 950 cells/uL   Eosinophils Absolute 1,496 (H) 15 - 500 cells/uL   Basophils Absolute 26 0 - 200 cells/uL   Neutrophils Relative % 50.3 %   Total Lymphocyte 27.1 %   Monocytes Relative 5.3 %   Eosinophils Relative 17.0 %   Basophils Relative 0.3 %  TSH + free T4     Status: None   Collection Time: 07/10/19 11:10 AM  Result Value Ref Range   TSH W/REFLEX TO FT4 0.78 mIU/L    Comment:           Reference Range .           > or = 20 Years  0.40-4.50 .                Pregnancy Ranges           First trimester    0.26-2.66           Second trimester   0.55-2.73           Third trimester    0.43-2.91   B-HCG Quant     Status: None   Collection Time: 07/10/19 11:13 AM  Result Value Ref Range   HCG, Total, QN <3 mIU/mL    Comment: Gestational Age   Expected hCG values (mIU/mL) <1 Week:                 5-50 1-2 Weeks:               50-500 2-3 Weeks:               100-5000 3-4 Weeks:               500-10000 4-5 Weeks:               1000-50000 5-6 Weeks:               10000-100000 6-8 Weeks:               15000-200000 2-3 Months:              10000-100000 The table above provides only a very rough estimate of gestational age and should be used only in conjunction with other methods for establishing gestational age. Much more reliable and accurate estimations of gestational age may be obtained by using LMP or ultrasound. . . Values from different assay methods may  vary. The use of this assay to monitor or to diagnose  patients with cancer or any condition unrelated to pregnancy has not been cleared or approved by the FDA or the manufacturer of the assay.   CBC (INCLUDES DIFF/PLT)   WITH PATHOLOGIST REVIEW     Status: Abnormal   Collection Time: 07/17/19  3:32 PM  Result Value Ref Range   WBC 8.4 3.8 - 10.8 Thousand/uL   RBC 4.06 3.80 - 5.10 Million/uL   Hemoglobin 12.7 11.7 - 15.5 g/dL   HCT 38.2 35.0 - 45.0 %   MCV 94.1 80.0 - 100.0 fL   MCH 31.3 27.0 - 33.0 pg   MCHC 33.2 32.0 - 36.0 g/dL   RDW 11.6 11.0 - 15.0 %   Platelets 206 140 - 400 Thousand/uL   MPV 12.6 (H) 7.5 - 12.5 fL   Neutro Abs 3,847 1,500 - 7,800 cells/uL   Lymphs Abs 2,822 850 - 3,900 cells/uL   Absolute Monocytes 512 200 - 950 cells/uL   Eosinophils Absolute 1,159 (H) 15 - 500 cells/uL   Basophils Absolute 59 0 - 200 cells/uL   Neutrophils Relative % 45.8 %   Total Lymphocyte 33.6 %   Monocytes Relative 6.1 %   Eosinophils Relative 13.8 %   Basophils Relative 0.7 %   Comment      Comment: Absolute eosinophilia, the most common associations being allergic disorders, certain parasitic infections and Hodgkin's Disease. Myeloid population consists predominantly of mature segmented neutrophils with mild reactive changes. No immature cells are identified. Review of the peripheral smear reveals adequate numbers of platelets. Elliptocytes noted. Iron studies may be of use, if clinically indicated. Reviewed by Marisa C. Mammarappallil, MD  (Electronic Signature on File)      07/20/2019   Ova and parasite examination     Status: None   Collection Time: 07/21/19  9:33 AM   Specimen: Stool  Result Value Ref Range   MICRO NUMBER: [id number]    SPECIMEN QUALITY: Adequate    Source STOOL    STATUS: FINAL    CONCENTRATE RESULT: No ova or parasites seen    TRICHROME RESULT: No ova or parasites seen    COMMENT:      Routine Ova and Parasite exam may not detect some parasites  that occasionally cause diarrheal illness. Test code(s) 37213 (Cryptosporidium Ag., DFA) and/or 10018 (Cyclospora and Isospora Exam) may be ordered to detect these parasites. One negative sample  does not necessarily rule out the presence of a parasitic infection.  For additional information, please refer to https://education.[facility].com/faq/FAQ203 (This link is being provided for informational/ educational purposes only.)   Gastrointestinal Pathogen Panel PCR     Status: Abnormal   Collection Time: 07/21/19  9:33 AM  Result Value Ref Range   Campylobacter, PCR NOT DETECTED NOT DETECT   C. difficile Tox A/B, PCR NOT DETECTED NOT DETECT   E coli 0157, PCR NOT DETECTED NOT DETECT   E coli (ETEC) LT/ST PCR NOT DETECTED NOT DETECT   E coli (STEC) stx1/stx2, PCR NOT DETECTED NOT DETECT   Salmonella, PCR NOT DETECTED NOT DETECT   Shigella, PCR NOT DETECTED NOT DETECT   Norovirus, PCR NOT DETECTED NOT DETECT   Rotavirus A, PCR NOT DETECTED NOT DETECT   Giardia lamblia, PCR DETECTED (A) NOT DETECT   Cryptosporidium, PCR NOT DETECTED NOT DETECT    Comment: . The xTAG(R) Gastrointestinal Pathogen Panel results are presumptive and must be confirmed by FDA-cleared tests or other acceptable reference methods. The results of this test should not be used as the sole basis for diagnosis, treatment, or other patient management decisions. . Performed using the [employer] xTAG(R) Gastrointestinal Pathogen Panel test kit.      Assessment and Plan: 33 y.o. female   with   .Avah was seen today for follow-up.  Diagnoses and all orders for this visit:  Eosinophilia -     CBC (INCLUDES DIFF/PLT) WITH PATHOLOGIST REVIEW  Giardiasis (lambliasis) -     Giardia Antigen (Stool) -     CBC (INCLUDES DIFF/PLT) WITH PATHOLOGIST REVIEW  Epigastric pain -     CBC with Differential/Platelet  Nausea without vomiting -     CBC with Differential/Platelet   Recommended she replace her oral  thermometer and continue daily temperature logs for the next 4 weeks to ensure resolution of possible fevers  Completed Tinidazole single dose treatment 8/18 Re-check Giardia antigen and CBC no sooner than 2 weeks to ensure resolution of infection and eosinophilia  Patient education and anticipatory guidance given Patient agrees with treatment plan Follow-up in 1 month with Dr. A or sooner as needed if symptoms worsen or fail to improve  Charley E. Cummings PA-C  

## 2019-08-07 ENCOUNTER — Encounter: Payer: Self-pay | Admitting: Gastroenterology

## 2019-08-07 ENCOUNTER — Telehealth: Payer: Self-pay | Admitting: *Deleted

## 2019-08-07 NOTE — Telephone Encounter (Signed)
  Follow up Call-  Call back number 08/05/2019  Post procedure Call Back phone  # [phone]  Permission to leave phone message Yes  Some recent data might be hidden     Patient questions:  Do you have a fever, pain , or abdominal swelling? No. Pain Score  0 *  Have you tolerated food without any problems? Yes.    Have you been able to return to your normal activities? Yes.    Do you have any questions about your discharge instructions: Diet   No. Medications  No. Follow up visit  No.  Do you have questions or concerns about your Care? No.  Actions: * If pain score is 4 or above: No action needed, pain <4.  1. Have you developed a fever since your procedure? no  2.   Have you had an respiratory symptoms (SOB or cough) since your procedure? no  3.   Have you tested positive for COVID 19 since your procedure no  4.   Have you had any family members/close contacts diagnosed with the COVID 19 since your procedure? no   If yes to any of these questions please route to Joylene John, RN and Alphonsa Gin, Therapist, sports.

## 2019-08-20 ENCOUNTER — Ambulatory Visit: Payer: No Typology Code available for payment source | Admitting: Hematology & Oncology

## 2019-08-20 ENCOUNTER — Other Ambulatory Visit: Payer: No Typology Code available for payment source

## 2019-09-03 LAB — CBC (INCLUDES DIFF/PLT) WITH PATHOLOGIST REVIEW
Absolute Monocytes: 511 cells/uL (ref 200–950)
Basophils Absolute: 50 cells/uL (ref 0–200)
Basophils Relative: 0.7 %
Eosinophils Absolute: 213 cells/uL (ref 15–500)
Eosinophils Relative: 3 %
HCT: 36.4 % (ref 35.0–45.0)
Hemoglobin: 12.3 g/dL (ref 11.7–15.5)
Lymphs Abs: 2457 cells/uL (ref 850–3900)
MCH: 31.6 pg (ref 27.0–33.0)
MCHC: 33.8 g/dL (ref 32.0–36.0)
MCV: 93.6 fL (ref 80.0–100.0)
MPV: 12 fL (ref 7.5–12.5)
Monocytes Relative: 7.2 %
Neutro Abs: 3870 cells/uL (ref 1500–7800)
Neutrophils Relative %: 54.5 %
Platelets: 216 10*3/uL (ref 140–400)
RBC: 3.89 10*6/uL (ref 3.80–5.10)
RDW: 11.4 % (ref 11.0–15.0)
Total Lymphocyte: 34.6 %
WBC: 7.1 10*3/uL (ref 3.8–10.8)

## 2019-09-04 LAB — GIARDIA ANTIGEN
MICRO NUMBER:: 944880
RESULT:: NOT DETECTED
SPECIMEN QUALITY:: ADEQUATE

## 2019-09-09 ENCOUNTER — Other Ambulatory Visit: Payer: Self-pay

## 2019-09-09 ENCOUNTER — Encounter: Payer: Self-pay | Admitting: Osteopathic Medicine

## 2019-09-09 ENCOUNTER — Ambulatory Visit (INDEPENDENT_AMBULATORY_CARE_PROVIDER_SITE_OTHER): Payer: Commercial Managed Care - PPO | Admitting: Osteopathic Medicine

## 2019-09-09 VITALS — BP 113/68 | HR 58 | Temp 98.2°F | Wt 112.8 lb

## 2019-09-09 DIAGNOSIS — A071 Giardiasis [lambliasis]: Secondary | ICD-10-CM

## 2019-09-09 DIAGNOSIS — Z3169 Encounter for other general counseling and advice on procreation: Secondary | ICD-10-CM

## 2019-09-09 NOTE — Progress Notes (Signed)
HPI: Dana Eaton is a 33 y.o. female who  has a past medical history of Anemia.  she presents to [facility] today, 09/09/19,  for chief complaint of:  Labs, giardia f/u  Very pleasant patient here to transition care from 1 of my partners who has left this practice.  She was recently diagnosed with eosinophilia and work-up revealed Giardia as cause for this as well as abdominal pain issues.  Patient states that abdominal pain has resolved, no significant headache issues at this point.  She had some questions about the Giardia testing recently done, namely that the result mentioned that false negatives were possible given variable shedding.  Patient is just concerned since she and her husband are thinking of trying to get pregnant in the near future.   At today's visit 09/09/19 ... PMH, PSH, FH reviewed and updated as needed.  Current medication list and allergy/intolerance hx reviewed and updated as needed. (See remainder of HPI, ROS, Phys Exam below)   No results found.  No results found for this or any previous visit (from the past 72 hour(s)).        ASSESSMENT/PLAN: The primary encounter diagnosis was Giardia. A diagnosis of Encounter for preconception consultation was also pertinent to this visit.   I am okay to repeat stool testing though with one negative test plus resolution of symptoms, I can feel pretty confident that illness has passed.  Discussed option for starting prenatal vitamins, recommended this if the patient is going to try to conceive soon.  She has an OB/GYN.    Orders Placed This Encounter  Procedures  . Giardia Antigen (Stool)       Follow-up plan: Return if symptoms worsen or fail to improve, for otherwise when due for annual / depending on  pregnancy.                                                 ################################################# ################################################# ################################################# #################################################    Current Meds  Medication Sig  . Lactobacillus (CULTURELLE DIGESTIVE WOMENS) CAPS Take 1 capsule by mouth daily.  . ondansetron (ZOFRAN ODT) 4 MG disintegrating tablet Take 1 tablet (4 mg total) by mouth every 6 (six) hours as needed for nausea or vomiting.  . Prenatal Vit-Fe Fumarate-FA (PRENATAL MULTIVITAMIN) TABS tablet Take 1 tablet by mouth daily at 12 noon.    Allergies  Allergen Reactions  . Acetaminophen-Caff-Pyrilamine Rash       Review of Systems:  Constitutional: No fever/chills  HEENT: No  headache, no vision change  Cardiac: No  chest pain, No  pressure, No palpitations  Respiratory:  No  shortness of breath. No  Cough  Gastrointestinal: No  abdominal pain, no change on bowel habits  Musculoskeletal: No new myalgia/arthralgia  Psychiatric: No  concerns with depression, No  concerns with anxiety  Exam:  BP 113/68 (BP Location: Left Arm, Patient Position: Sitting, Cuff Size: Normal)   Pulse (!) 58   Temp 98.2 F (36.8 C) (Oral)   Wt 112 lb 12.8 oz (51.2 kg)   BMI 19.36 kg/m   Constitutional: VS see above. General Appearance: alert, well-developed, well-nourished, NAD  Neck: No masses, trachea midline.   Respiratory: Normal respiratory effort. no wheeze, no rhonchi, no rales  Cardiovascular: S1/S2 normal, no murmur, no rub/gallop auscultated. RRR.   Musculoskeletal: Gait normal. Symmetric and  independent movement of all extremities  Abdominal: non-tender, non-distended, no appreciable organomegaly, neg Murphy's, BS WNLx4  Neurological: Normal balance/coordination. No tremor.  Skin: warm, dry, intact.   Psychiatric: Normal judgment/insight.  Normal mood and affect. Oriented x3.       Visit summary with medication list and pertinent instructions was printed for patient to review, patient was advised to alert [place] if any updates are needed. All questions at time of visit were answered - patient instructed to contact office with any additional concerns. ER/RTC precautions were reviewed with the patient and understanding verbalized.   Note: Total time spent 25 minutes, greater than 50% of the visit was spent face-to-face counseling and coordinating care for the following: The primary encounter diagnosis was Giardia. A diagnosis of Encounter for preconception consultation was also pertinent to this visit.Marland Kitchen  Please note: voice recognition software was used to produce this document, and typos may escape review. Please contact Dr. Sheppard Coil for any needed clarifications.    Follow up plan: Return if symptoms worsen or fail to improve, for otherwise when due for annual / depending on pregnancy.

## 2019-09-17 ENCOUNTER — Encounter: Payer: Self-pay | Admitting: *Deleted

## 2019-10-14 LAB — GIARDIA ANTIGEN
MICRO NUMBER:: 1085677
RESULT:: NOT DETECTED
SPECIMEN QUALITY:: ADEQUATE

## 2019-12-04 NOTE — L&D Delivery Note (Addendum)
..  OB/GYN Faculty Practice Delivery Note  Dana Eaton is a 34 y.o. G2P1001 s/p vaginal delivery at [redacted]w[redacted]d. She was admitted for IOL for post dates    ROM: 2h 92m with moderate amount of fluid GBS Status: negative  Maximum Maternal Temperature: 98.8  Labor Progress: [place] admitted and AROM'ed then progressed normally to spontaneous urge to push   Delivery Date/Time: 8.6.21 1527  Delivery: Called to room and patient was complete and pushing. [place] in hands and knees position. Head delivered in anterior position.  After head was out, Mother continued with push efforts but shoulder was impacted.  Gaskins maneuver done, CNM took over delivery and attempted delivery of posterior (right), but and when unsuccessful she was flipped over to [place] with suprapubic pressure, rotation of posterior shoulder clockwise and delivered which resolved the dystocia. Body still slow to delivery. Anterior axilla hooked and body delivered. Total shoulder dystocia time: 45 seconds. Baby placed on Mother's chest and dried and stimulated but was found to require more support so cord was clamped and cut and baby was taken to the warmer. Baby w/ good HR and breathing w/ stimulation but requiring O2 to maintain O2 Sats and was restracting so NICU called. Cord blood drawn. Placenta delivered spontaneously with gentle cord traction. Fundus firm with massage and Pitocin. Labia, perineum, vagina, and cervix inspected and a second degree perineal laceration was discovered and repaired with Vicryl 3.0   Placenta: intact; 1336 Complications: should dystocia  Lacerations: 2nd degree perineal  EBL: 400 ml Analgesia: 50 mcg of Fentanyl administered prior to repair    Postpartum Planning [x]  message to sent to schedule follow-up  Natural family planning  Breastfeeding  Circumcision planned   Infant: 7/9  APGARs  9 lbs. 14.4 oz.  4490 g  9/9, SNM  [facility], [facility] 07/08/2020, 4:35 PM  I was present for the entire delivery of baby, took over delivery during shoulder dystocia and was at the [facility] for delivery of placenta and repair and agree with above.  [facility], Katrinka Blazing, CNM 07/08/2020 5:20 PM

## 2019-12-05 ENCOUNTER — Ambulatory Visit (HOSPITAL_COMMUNITY)
Admission: EM | Admit: 2019-12-05 | Discharge: 2019-12-05 | Disposition: A | Discharge status: Home / Self Care | Payer: No Typology Code available for payment source | Attending: Family Medicine | Admitting: Family Medicine

## 2019-12-05 ENCOUNTER — Encounter (HOSPITAL_COMMUNITY): Payer: Self-pay

## 2019-12-05 ENCOUNTER — Other Ambulatory Visit: Payer: Self-pay

## 2019-12-05 DIAGNOSIS — Z3490 Encounter for supervision of normal pregnancy, unspecified, unspecified trimester: Secondary | ICD-10-CM | POA: Diagnosis present

## 2019-12-05 DIAGNOSIS — Z20822 Contact with and (suspected) exposure to covid-19: Secondary | ICD-10-CM | POA: Insufficient documentation

## 2019-12-05 DIAGNOSIS — R070 Pain in throat: Secondary | ICD-10-CM

## 2019-12-05 DIAGNOSIS — Z3201 Encounter for pregnancy test, result positive: Secondary | ICD-10-CM | POA: Diagnosis not present

## 2019-12-05 LAB — POCT RAPID STREP A: Streptococcus, Group A Screen (Direct): NEGATIVE

## 2019-12-05 LAB — POCT PREGNANCY, URINE: Preg Test, Ur: POSITIVE — AB

## 2019-12-05 LAB — POC URINE PREG, ED: Preg Test, Ur: POSITIVE — AB

## 2019-12-05 NOTE — ED Provider Notes (Signed)
[facility]    CSN: [id number] Arrival date & time: 12/05/19  1046      History   Chief Complaint Chief Complaint  Patient presents with  . Sore Throat    HPI Dana Eaton is a 34 y.o. female.   HPI [place] present for COVID -19 testing and urine HCG to confirm pregnancy. Patient currently experience a discomfort of throat x 3 days. Throat is not severely painful, feels swollen and excessively dry. Patient took a home pregnancy test early November which confirmed positive pregnancy. Based on LMP, patient is approximately [redacted] weeks pregnant. Pt endorses acid reflux symptoms since learning of pregnancy. No other significant symptoms present today.  Past Medical History:  Diagnosis Date  . Anemia     Patient Active Problem List   Diagnosis Date Noted  . Recurrent fever of unknown etiology 07/28/2019  . Migraine without aura and without status migrainosus, not intractable 07/28/2019  . Eosinophilia 07/12/2019  . Generalized abdominal pain 07/10/2019  . Diastasis recti 05/02/2018  . Dyspepsia and disorder of function of stomach 05/02/2018  . Right lower quadrant abdominal pain 05/02/2018  . Mastitis 06/07/2017  . Family history of osteoporosis 08/17/2014    Past Surgical History:  Procedure Laterality Date  . MANDIBLE SURGERY      OB History    Gravida  2   Para  1   Term  1   Preterm      AB      Living  1     SAB      TAB      Ectopic      Multiple  0   Live Births  1            Home Medications    Prior to Admission medications   Medication Sig Start Date End Date Taking? Authorizing Provider  Prenatal Vit-Fe Fumarate-FA (PRENATAL MULTIVITAMIN) TABS tablet Take 1 tablet by mouth daily at 12 noon.   Yes [provider]  Lactobacillus (CULTURELLE DIGESTIVE WOMENS) CAPS Take 1 capsule by mouth daily. 07/10/19   Carlis Stable, PA-C  ondansetron (ZOFRAN ODT) 4 MG disintegrating tablet Take 1 tablet (4  mg total) by mouth every 6 (six) hours as needed for nausea or vomiting. 07/27/19   Lynann Bologna, MD    Family History Family History  Problem Relation Age of Onset  . Osteoarthritis Maternal Grandmother   . Osteoporosis Maternal Grandmother   . Pancreatic cancer Maternal Grandmother 27  . Seizures Father   . Seizures Sister   . Colon cancer Paternal Grandmother   . Esophageal cancer Neg Hx   . Rectal cancer Neg Hx   . Stomach cancer Neg Hx     Social History Social History   Tobacco Use  . Smoking status: Never Smoker  . Smokeless tobacco: Never Used  . Tobacco comment: tried it once before-smoking   Substance Use Topics  . Alcohol use: Not Currently    Alcohol/week: 0.0 standard drinks  . Drug use: No     Allergies   Patient has no known allergies.   Review of Systems Review of Systems Pertinent negatives listed in HPI Physical Exam Triage Vital Signs ED Triage Vitals  Enc Vitals Group     BP 12/05/19 1145 120/79     Pulse Rate 12/05/19 1145 69     Resp 12/05/19 1145 16     Temp 12/05/19 1145 98.7 F (37.1 C)     Temp Source 12/05/19  1145 Oral     SpO2 12/05/19 1145 99 %     Weight --      Height --      Head Circumference --      Peak Flow --      Pain Score 12/05/19 1142 1     Pain Loc --      Pain Edu? --      Excl. in GC? --    No data found.  Updated Vital Signs BP 120/79 (BP Location: Right Arm)   Pulse 69   Temp 98.7 F (37.1 C) (Oral)   Resp 16   LMP 10/05/2019 (Exact Date)   SpO2 99%   Visual Acuity Right Eye Distance:   Left Eye Distance:   Bilateral Distance:    Right Eye Near:   Left Eye Near:    Bilateral Near:     Physical Exam Constitutional:      Appearance: Normal appearance. She is not ill-appearing.  HENT:     Head: Normocephalic.     Nose: No congestion.  Cardiovascular:     Rate and Rhythm: Normal rate and regular rhythm.     Pulses: Normal pulses.  Pulmonary:     Effort: Pulmonary effort is normal.      Breath sounds: Normal breath sounds.  Musculoskeletal:     Cervical back: No rigidity.  Lymphadenopathy:     Cervical: No cervical adenopathy.  Neurological:     Mental Status: She is alert.      UC Treatments / Results  Labs (all labs ordered are listed, but only abnormal results are displayed) Labs Reviewed  NOVEL CORONAVIRUS, NAA (HOSP ORDER, SEND-OUT TO REF LAB; TAT 18-24 HRS)    EKG   Radiology No results found.  Procedures Procedures (including critical care time)  Medications Ordered in UC Medications - No data to display  Initial Impression / Assessment and Plan / UC Course  I have reviewed the triage vital signs and the nursing notes.  Pertinent labs & imaging results that were available during my care of the patient were reviewed by me and considered in my medical decision making (see chart for details). Physical exam findings unremarkable today. Pt is pregnant and already scheduled to follow-up with OBGYN with a few weeks. Dry throat/throat irritation is likely related to GERD associated with pregnancy. Recommended Tums for management of GERD.  Salt water gargles recommended for management of dry/throat irritation. Patient verbalized understanding and agreement with plan.  Clinical Course as of Dec 05 1239  Sat Dec 05, 2019  1238 Pregnancy, urine POC [KH]    Clinical Course User Index [KH] Bing Neighbors, FNP     Final Clinical Impressions(s) / UC Diagnoses   Final diagnoses:  Throat discomfort  Pregnancy, unspecified gestational age  Encounter for laboratory testing for COVID-19 virus     Discharge Instructions     If you have a sore or scratchy throat, use a saltwater gargle-  to  teaspoon of salt dissolved in a 4-ounce to 8-ounce glass of warm water.  Gargle the solution for approximately 15-30 seconds and then spit.  It is important not to swallow the solution.  You can also use throat lozenges/cough drops and Chloraseptic spray to help  with throat pain or discomfort.  Warm or cold liquids can also be helpful in relieving throat pain.  You can also take OTC Tums with calcium to manage Acid Reflux symptoms.  Follow-up with OB/GYN as scheduled. Continue to hydrate well  with water.  All negative covid results will be sent to MyChart. If your results are positive, you will be notified via phone call.    ED Prescriptions    None     PDMP not reviewed this encounter.   Scot Jun, FNP 12/07/19 [phone]

## 2019-12-05 NOTE — ED Triage Notes (Signed)
Patient presents to Urgent Care with complaints of sore throat since the past 3 nights. Patient reports it started out feeling dry and now it feels swollen, no difficulty swallowing or controlling secretions.

## 2019-12-05 NOTE — Discharge Instructions (Addendum)
If you have a sore or scratchy throat, use a saltwater gargle-  to  teaspoon of salt dissolved in a 4-ounce to 8-ounce glass of warm water.  Gargle the solution for approximately 15-30 seconds and then spit.  It is important not to swallow the solution.  You can also use throat lozenges/cough drops and Chloraseptic spray to help with throat pain or discomfort.  Warm or cold liquids can also be helpful in relieving throat pain.  You can also take OTC Tums with calcium to manage Acid Reflux symptoms.  Follow-up with OB/GYN as scheduled. Continue to hydrate well with water.  All negative covid results will be sent to MyChart. If your results are positive, you will be notified via phone call.

## 2019-12-06 LAB — NOVEL CORONAVIRUS, NAA (HOSP ORDER, SEND-OUT TO REF LAB; TAT 18-24 HRS): SARS-CoV-2, NAA: NOT DETECTED

## 2019-12-07 LAB — CULTURE, GROUP A STREP (THRC)

## 2019-12-14 ENCOUNTER — Encounter: Payer: Self-pay | Admitting: Obstetrics & Gynecology

## 2019-12-14 ENCOUNTER — Ambulatory Visit (INDEPENDENT_AMBULATORY_CARE_PROVIDER_SITE_OTHER): Payer: Commercial Managed Care - PPO | Admitting: Obstetrics & Gynecology

## 2019-12-14 ENCOUNTER — Other Ambulatory Visit: Payer: Self-pay

## 2019-12-14 VITALS — BP 109/63 | HR 75 | Temp 98.7°F | Wt 118.0 lb

## 2019-12-14 DIAGNOSIS — Z3A11 11 weeks gestation of pregnancy: Secondary | ICD-10-CM

## 2019-12-14 DIAGNOSIS — R110 Nausea: Secondary | ICD-10-CM

## 2019-12-14 DIAGNOSIS — O26899 Other specified pregnancy related conditions, unspecified trimester: Secondary | ICD-10-CM | POA: Diagnosis not present

## 2019-12-14 DIAGNOSIS — Z113 Encounter for screening for infections with a predominantly sexual mode of transmission: Secondary | ICD-10-CM

## 2019-12-14 DIAGNOSIS — Z3480 Encounter for supervision of other normal pregnancy, unspecified trimester: Secondary | ICD-10-CM

## 2019-12-14 NOTE — Progress Notes (Signed)
Subjective:    Dana Eaton is a G2P1001 [redacted]w[redacted]d being seen today for her first obstetrical visit.  Her obstetrical history is significant for normal vaginal delivery in past.. Patient does intend to breast feed. Pregnancy history fully reviewed.  Patient reports nausea.  Vitals:   12/14/19 0912  BP: 109/63  Pulse: 75  Temp: 98.7 F (37.1 C)  Weight: 118 lb (53.5 kg)    HISTORY: OB History  Gravida Para Term Preterm AB Living  2 1 1     1   SAB TAB Ectopic Multiple Live Births        0 1    # Outcome Date GA Lbr Len/2nd Weight Sex Delivery Anes PTL Lv  2 Current           1 Term 10/04/16 [redacted]w[redacted]d 10:49 / 00:57 7 lb 12.3 oz (3.524 kg) F Vag-Spont None  LIV   Past Medical History:  Diagnosis Date  . Anemia   . History of intestinal parasite    Past Surgical History:  Procedure Laterality Date  . MANDIBLE SURGERY     Family History  Problem Relation Age of Onset  . Osteoarthritis Maternal Grandmother   . Osteoporosis Maternal Grandmother   . Pancreatic cancer Maternal Grandmother 76  . Seizures Father   . Seizures Sister   . Colon cancer Paternal Grandmother   . Esophageal cancer Neg Hx   . Rectal cancer Neg Hx   . Stomach cancer Neg Hx      Exam    Uterus:     Pelvic Exam:    Perineum: No Hemorrhoids   Vulva: normal   Vagina:  normal mucosa, normal discharge   pH: n/a   Cervix: no cervical motion tenderness and no lesions   Adnexa: normal adnexa   Bony Pelvis: average  System: Breast:  normal appearance, no masses or tenderness   Skin: normal coloration and turgor, no rashes    Neurologic: oriented, normal mood   Extremities: normal strength, tone, and muscle mass   HEENT extra ocular movement intact, sclera clear, anicteric, neck supple with midline trachea, thyroid without masses and trachea midline   Mouth/Teeth Not evaluated; has been to dentist this year.   Neck supple and no masses   Cardiovascular: regular rate and rhythm   Respiratory:   appears well, vitals normal, no respiratory distress, acyanotic, normal RR, chest clear, no wheezing, crepitations, rhonchi, normal symmetric air entry   Abdomen: soft, non-tender; bowel sounds normal; no masses,  no organomegaly   Urinary: urethral meatus normal      Assessment:    Pregnancy: G2P1001 Patient Active Problem List   Diagnosis Date Noted  . Supervision of other normal pregnancy, antepartum 12/14/2019  . Recurrent fever of unknown etiology 07/28/2019  . Migraine without aura and without status migrainosus, not intractable 07/28/2019  . Eosinophilia 07/12/2019  . Generalized abdominal pain 07/10/2019  . Diastasis recti 05/02/2018  . Dyspepsia and disorder of function of stomach 05/02/2018  . Right lower quadrant abdominal pain 05/02/2018  . Mastitis 06/07/2017  . Family history of osteoporosis 08/17/2014        Plan:   Initial labs drawn. Prenatal vitamins. Problem list reviewed and updated. Pt has longer cycles and can explain why she is measuring 1 weeks ahead of LMP.   Genetic Screening discussed Pt wants NIPS and aFP; Wants nIPS early (12 weeks); with AFP at 16 weeks.  Ultrasound discussed; fetal survey: ordered.  Follow up in 5 weeks for visit with AFP  [facility] opt. schedule   Elsie Lincoln 12/14/2019

## 2019-12-14 NOTE — Progress Notes (Signed)
Bedside U/S shows single IUP with FHT of 168 BPM and CRL measures 40.62mm  GA is 11w

## 2019-12-14 NOTE — Patient Instructions (Signed)
First Trimester of Pregnancy The first trimester of pregnancy is from week 1 until the end of week 13 (months 1 through 3). A week after a sperm fertilizes an egg, the egg will implant on the wall of the uterus. This embryo will begin to develop into a baby. Genes from you and your partner will form the baby. The female genes will determine whether the baby will be a boy or a girl. At 6-8 weeks, the eyes and face will be formed, and the heartbeat can be seen on ultrasound. At the end of 12 weeks, all the baby's organs will be formed. Now that you are pregnant, you will want to do everything you can to have a healthy baby. Two of the most important things are to get good prenatal care and to follow your health care provider's instructions. Prenatal care is all the medical care you receive before the baby's birth. This care will help prevent, find, and treat any problems during the pregnancy and childbirth. Body changes during your first trimester Your body goes through many changes during pregnancy. The changes vary from woman to woman.  You may gain or lose a couple of pounds at first.  You may feel sick to your stomach (nauseous) and you may throw up (vomit). If the vomiting is uncontrollable, call your health care provider.  You may tire easily.  You may develop headaches that can be relieved by medicines. All medicines should be approved by your health care provider.  You may urinate more often. Painful urination may mean you have a bladder infection.  You may develop heartburn as a result of your pregnancy.  You may develop constipation because certain hormones are causing the muscles that push stool through your intestines to slow down.  You may develop hemorrhoids or swollen veins (varicose veins).  Your breasts may begin to grow larger and become tender. Your nipples may stick out more, and the tissue that surrounds them (areola) may become darker.  Your gums may bleed and may be  sensitive to brushing and flossing.  Dark spots or blotches (chloasma, mask of pregnancy) may develop on your face. This will likely fade after the baby is born.  Your menstrual periods will stop.  You may have a loss of appetite.  You may develop cravings for certain kinds of food.  You may have changes in your emotions from day to day, such as being excited to be pregnant or being concerned that something may go wrong with the pregnancy and baby.  You may have more vivid and strange dreams.  You may have changes in your hair. These can include thickening of your hair, rapid growth, and changes in texture. Some women also have hair loss during or after pregnancy, or hair that feels dry or thin. Your hair will most likely return to normal after your baby is born. What to expect at prenatal visits During a routine prenatal visit:  You will be weighed to make sure you and the baby are growing normally.  Your blood pressure will be taken.  Your abdomen will be measured to track your baby's growth.  The fetal heartbeat will be listened to between weeks 10 and 14 of your pregnancy.  Test results from any previous visits will be discussed. Your health care provider may ask you:  How you are feeling.  If you are feeling the baby move.  If you have had any abnormal symptoms, such as leaking fluid, bleeding, severe headaches, or abdominal   cramping.  If you are using any tobacco products, including cigarettes, chewing tobacco, and electronic cigarettes.  If you have any questions. Other tests that may be performed during your first trimester include:  Blood tests to find your blood type and to check for the presence of any previous infections. The tests will also be used to check for low iron levels (anemia) and protein on red blood cells (Rh antibodies). Depending on your risk factors, or if you previously had diabetes during pregnancy, you may have tests to check for high blood sugar  that affects pregnant women (gestational diabetes).  Urine tests to check for infections, diabetes, or protein in the urine.  An ultrasound to confirm the proper growth and development of the baby.  Fetal screens for spinal cord problems (spina bifida) and Down syndrome.  HIV (human immunodeficiency virus) testing. Routine prenatal testing includes screening for HIV, unless you choose not to have this test.  You may need other tests to make sure you and the baby are doing well. Follow these instructions at home: Medicines  Follow your health care provider's instructions regarding medicine use. Specific medicines may be either safe or unsafe to take during pregnancy.  Take a prenatal vitamin that contains at least 600 micrograms (mcg) of folic acid.  If you develop constipation, try taking a stool softener if your health care provider approves. Eating and drinking   Eat a balanced diet that includes fresh fruits and vegetables, whole grains, good sources of protein such as meat, eggs, or tofu, and low-fat dairy. Your health care provider will help you determine the amount of weight gain that is right for you.  Avoid raw meat and uncooked cheese. These carry germs that can cause birth defects in the baby.  Eating four or five small meals rather than three large meals a day may help relieve nausea and vomiting. If you start to feel nauseous, eating a few soda crackers can be helpful. Drinking liquids between meals, instead of during meals, also seems to help ease nausea and vomiting.  Limit foods that are high in fat and processed sugars, such as fried and sweet foods.  To prevent constipation: ? Eat foods that are high in fiber, such as fresh fruits and vegetables, whole grains, and beans. ? Drink enough fluid to keep your urine clear or pale yellow. Activity  Exercise only as directed by your health care provider. Most women can continue their usual exercise routine during  pregnancy. Try to exercise for 30 minutes at least 5 days a week. Exercising will help you: ? Control your weight. ? Stay in shape. ? Be prepared for labor and delivery.  Experiencing pain or cramping in the lower abdomen or lower back is a good sign that you should stop exercising. Check with your health care provider before continuing with normal exercises.  Try to avoid standing for long periods of time. Move your legs often if you must stand in one place for a long time.  Avoid heavy lifting.  Wear low-heeled shoes and practice good posture.  You may continue to have sex unless your health care provider tells you not to. Relieving pain and discomfort  Wear a good support bra to relieve breast tenderness.  Take warm sitz baths to soothe any pain or discomfort caused by hemorrhoids. Use hemorrhoid cream if your health care provider approves.  Rest with your legs elevated if you have leg cramps or low back pain.  If you develop varicose veins in   your legs, wear support hose. Elevate your feet for 15 minutes, 3-4 times a day. Limit salt in your diet. Prenatal care  Schedule your prenatal visits by the twelfth week of pregnancy. They are usually scheduled monthly at first, then more often in the last 2 months before delivery.  Write down your questions. Take them to your prenatal visits.  Keep all your prenatal visits as told by your health care provider. This is important. Safety  Wear your seat belt at all times when driving.  Make a list of emergency phone numbers, including numbers for family, friends, the hospital, and police and fire departments. General instructions  Ask your health care provider for a referral to a local prenatal education class. Begin classes no later than the beginning of month 6 of your pregnancy.  Ask for help if you have counseling or nutritional needs during pregnancy. Your health care provider can offer advice or refer you to specialists for help  with various needs.  Do not use hot tubs, steam rooms, or saunas.  Do not douche or use tampons or scented sanitary pads.  Do not cross your legs for long periods of time.  Avoid cat litter boxes and soil used by cats. These carry germs that can cause birth defects in the baby and possibly loss of the fetus by miscarriage or stillbirth.  Avoid all smoking, herbs, alcohol, and medicines not prescribed by your health care provider. Chemicals in these products affect the formation and growth of the baby.  Do not use any products that contain nicotine or tobacco, such as cigarettes and e-cigarettes. If you need help quitting, ask your health care provider. You may receive counseling support and other resources to help you quit.  Schedule a dentist appointment. At home, brush your teeth with a soft toothbrush and be gentle when you floss. Contact a health care provider if:  You have dizziness.  You have mild pelvic cramps, pelvic pressure, or nagging pain in the abdominal area.  You have persistent nausea, vomiting, or diarrhea.  You have a bad smelling vaginal discharge.  You have pain when you urinate.  You notice increased swelling in your face, hands, legs, or ankles.  You are exposed to fifth disease or chickenpox.  You are exposed to German measles (rubella) and have never had it. Get help right away if:  You have a fever.  You are leaking fluid from your vagina.  You have spotting or bleeding from your vagina.  You have severe abdominal cramping or pain.  You have rapid weight gain or loss.  You vomit blood or material that looks like coffee grounds.  You develop a severe headache.  You have shortness of breath.  You have any kind of trauma, such as from a fall or a car accident. Summary  The first trimester of pregnancy is from week 1 until the end of week 13 (months 1 through 3).  Your body goes through many changes during pregnancy. The changes vary from  woman to woman.  You will have routine prenatal visits. During those visits, your health care provider will examine you, discuss any test results you may have, and talk with you about how you are feeling. This information is not intended to replace advice given to you by your health care provider. Make sure you discuss any questions you have with your health care provider. Document Revised: 11/01/2017 Document Reviewed: 10/31/2016 Elsevier Patient Education  2020 [employer]  

## 2019-12-15 LAB — OBSTETRIC PANEL
Absolute Monocytes: 624 cells/uL (ref 200–950)
Antibody Screen: NOT DETECTED
Basophils Absolute: 40 cells/uL (ref 0–200)
Basophils Relative: 0.4 %
Eosinophils Absolute: 158 cells/uL (ref 15–500)
Eosinophils Relative: 1.6 %
HCT: 37 % (ref 35.0–45.0)
Hemoglobin: 12.5 g/dL (ref 11.7–15.5)
Hepatitis B Surface Ag: NONREACTIVE
Lymphs Abs: 2208 cells/uL (ref 850–3900)
MCH: 32.1 pg (ref 27.0–33.0)
MCHC: 33.8 g/dL (ref 32.0–36.0)
MCV: 94.9 fL (ref 80.0–100.0)
MPV: 12.1 fL (ref 7.5–12.5)
Monocytes Relative: 6.3 %
Neutro Abs: 6871 cells/uL (ref 1500–7800)
Neutrophils Relative %: 69.4 %
Platelets: 247 10*3/uL (ref 140–400)
RBC: 3.9 10*6/uL (ref 3.80–5.10)
RDW: 11.8 % (ref 11.0–15.0)
RPR Ser Ql: NONREACTIVE
Rubella: 4.36 Index
Total Lymphocyte: 22.3 %
WBC: 9.9 10*3/uL (ref 3.8–10.8)

## 2019-12-15 LAB — GC/CHLAMYDIA PROBE AMP (~~LOC~~) NOT AT ARMC
Chlamydia: NEGATIVE
Comment: NEGATIVE
Comment: NORMAL
Neisseria Gonorrhea: NEGATIVE

## 2019-12-15 LAB — HIV ANTIBODY (ROUTINE TESTING W REFLEX): HIV 1&2 Ab, 4th Generation: NONREACTIVE

## 2019-12-16 LAB — CULTURE, OB URINE

## 2019-12-16 LAB — URINE CULTURE, OB REFLEX

## 2019-12-21 ENCOUNTER — Ambulatory Visit: Payer: Commercial Managed Care - PPO

## 2019-12-21 ENCOUNTER — Other Ambulatory Visit: Payer: Self-pay

## 2019-12-22 ENCOUNTER — Ambulatory Visit: Payer: Commercial Managed Care - PPO

## 2020-01-06 ENCOUNTER — Other Ambulatory Visit: Payer: Self-pay

## 2020-01-06 ENCOUNTER — Ambulatory Visit (INDEPENDENT_AMBULATORY_CARE_PROVIDER_SITE_OTHER): Payer: Commercial Managed Care - PPO | Admitting: *Deleted

## 2020-01-06 DIAGNOSIS — Z3480 Encounter for supervision of other normal pregnancy, unspecified trimester: Secondary | ICD-10-CM

## 2020-01-06 NOTE — Progress Notes (Signed)
Pt here for Nipts redraw due to low fetal fraction

## 2020-01-18 ENCOUNTER — Other Ambulatory Visit: Payer: Self-pay

## 2020-01-18 ENCOUNTER — Ambulatory Visit (INDEPENDENT_AMBULATORY_CARE_PROVIDER_SITE_OTHER): Payer: No Typology Code available for payment source | Admitting: Obstetrics & Gynecology

## 2020-01-18 VITALS — BP 96/62 | HR 98 | Temp 98.3°F | Wt 124.0 lb

## 2020-01-18 DIAGNOSIS — O26892 Other specified pregnancy related conditions, second trimester: Secondary | ICD-10-CM | POA: Diagnosis not present

## 2020-01-18 DIAGNOSIS — G43009 Migraine without aura, not intractable, without status migrainosus: Secondary | ICD-10-CM | POA: Diagnosis not present

## 2020-01-18 DIAGNOSIS — Z3A16 16 weeks gestation of pregnancy: Secondary | ICD-10-CM | POA: Diagnosis not present

## 2020-01-18 DIAGNOSIS — Z3480 Encounter for supervision of other normal pregnancy, unspecified trimester: Secondary | ICD-10-CM

## 2020-01-18 NOTE — Progress Notes (Signed)
   PRENATAL VISIT NOTE  Subjective:  Dana Eaton is a 34 y.o. G2P1001 at [redacted]w[redacted]d being seen today for ongoing prenatal care.  She is currently monitored for the following issues for this low-risk pregnancy and has Family history of osteoporosis; Diastasis recti; Migraine without aura and without status migrainosus, not intractable; and Supervision of other normal pregnancy, antepartum on their problem list.  Patient reports no complaints.  Contractions: Not present. Vag. Bleeding: None.  Movement: Absent. Denies leaking of fluid.   The following portions of the patient's history were reviewed and updated as appropriate: allergies, current medications, past family history, past medical history, past social history, past surgical history and problem list.   Objective:   Vitals:   01/18/20 0910  BP: 96/62  Pulse: 98  Temp: 98.3 F (36.8 C)  Weight: 124 lb (56.2 kg)    Fetal Status: Fetal Heart Rate (bpm): 145   Movement: Absent     General:  Alert, oriented and cooperative. Patient is in no acute distress.  Skin: Skin is warm and dry. No rash noted.   Cardiovascular: Normal heart rate noted  Respiratory: Normal respiratory effort, no problems with respiration noted  Abdomen: Soft, gravid, appropriate for gestational age.  Pain/Pressure: Absent     Pelvic: Cervical exam deferred        Extremities: Normal range of motion.  Edema: None  Mental Status: Normal mood and affect. Normal behavior. Normal judgment and thought content.   Assessment and Plan:  Pregnancy: G2P1001 at [redacted]w[redacted]d 1. Supervision of other normal pregnancy, antepartum - Anatomy US ordered. - Baby Rx BPs crossing and nml - Alpha fetoprotein, maternal  2. Migraine without aura and without status migrainosus, not intractable Having mild headaches.  Can take Tylenol if she desires.    Preterm labor symptoms and general obstetric precautions including but not limited to vaginal bleeding, contractions, leaking of fluid  and fetal movement were reviewed in detail with the patient. Please refer to After Visit Summary for other counseling recommendations.    Future Appointments  Date Time Provider Department Center  02/08/2020  9:15 AM [facility] [place] [facility]  02/23/2020  8:15 AM Leftwich-Kirby, Wilmer Floor, CNM [facility]    Elsie Lincoln, MD

## 2020-01-27 LAB — ALPHA FETOPROTEIN, MATERNAL
AFP MoM: 0.85
AFP, Serum: 31.9 ng/mL
Calc'd Gestational Age: 16 weeks
Maternal Wt: 124 [lb_av]
Risk for ONTD: <1
Twins-AFP: 1

## 2020-01-27 LAB — TIQ-MISC

## 2020-02-08 ENCOUNTER — Ambulatory Visit (HOSPITAL_COMMUNITY)
Admission: RE | Admit: 2020-02-08 | Discharge: 2020-02-08 | Disposition: A | Discharge status: Home / Self Care | Payer: No Typology Code available for payment source | Source: Ambulatory Visit | Attending: Obstetrics and Gynecology | Admitting: Obstetrics and Gynecology

## 2020-02-08 ENCOUNTER — Other Ambulatory Visit: Payer: Self-pay

## 2020-02-08 DIAGNOSIS — Z363 Encounter for antenatal screening for malformations: Secondary | ICD-10-CM | POA: Diagnosis not present

## 2020-02-08 DIAGNOSIS — Z3A19 19 weeks gestation of pregnancy: Secondary | ICD-10-CM

## 2020-02-08 DIAGNOSIS — O26842 Uterine size-date discrepancy, second trimester: Secondary | ICD-10-CM

## 2020-02-08 DIAGNOSIS — Z3480 Encounter for supervision of other normal pregnancy, unspecified trimester: Secondary | ICD-10-CM | POA: Diagnosis present

## 2020-02-23 ENCOUNTER — Encounter: Payer: Self-pay | Admitting: Advanced Practice Midwife

## 2020-02-23 ENCOUNTER — Telehealth (INDEPENDENT_AMBULATORY_CARE_PROVIDER_SITE_OTHER): Payer: No Typology Code available for payment source | Admitting: Advanced Practice Midwife

## 2020-02-23 VITALS — BP 114/60 | Wt 129.0 lb

## 2020-02-23 DIAGNOSIS — Z3480 Encounter for supervision of other normal pregnancy, unspecified trimester: Secondary | ICD-10-CM

## 2020-02-23 DIAGNOSIS — O26899 Other specified pregnancy related conditions, unspecified trimester: Secondary | ICD-10-CM

## 2020-02-23 DIAGNOSIS — R102 Pelvic and perineal pain: Secondary | ICD-10-CM

## 2020-02-23 NOTE — Progress Notes (Signed)
     TELEHEALTH VIRTUAL OBSTETRICS VISIT ENCOUNTER NOTE  I connected with Dana Eaton on 02/23/20 at  8:15 AM EDT by MyChart virtual visit at home and verified that I am speaking with the correct person using two identifiers.   I discussed the limitations, risks, security and privacy concerns of performing an evaluation and management service by telephone and the availability of in person appointments. I also discussed with the patient that there may be a patient responsible charge related to this service. The patient expressed understanding and agreed to proceed.  Subjective:  Dana Eaton is a 34 y.o. G2P1001 at [redacted]w[redacted]d being followed for ongoing prenatal care.  She is currently monitored for the following issues for this low-risk pregnancy and has Family history of osteoporosis; Diastasis recti; Migraine without aura and without status migrainosus, not intractable; and Supervision of other normal pregnancy, antepartum on their problem list.  Patient reports occasional abdominal pain. Reports fetal movement. Denies any contractions, bleeding or leaking of fluid.   The following portions of the patient's history were reviewed and updated as appropriate: allergies, current medications, past family history, past medical history, past social history, past surgical history and problem list.   Objective:   General:  Alert, oriented and cooperative.   Mental Status: Normal mood and affect perceived. Normal judgment and thought content.  Rest of physical exam deferred due to type of encounter  Assessment and Plan:  Pregnancy: G2P1001 at [redacted]w[redacted]d 1. Supervision of other normal pregnancy, antepartum --Pt reports good fetal movement, denies cramping, LOF, or vaginal bleeding --Anticipatory guidance about next visits/weeks of pregnancy given. --BP 114/60 today on home cuff --Next visit in person in 4 weeks for glucose test  2. Pain of round ligament affecting pregnancy, antepartum --Pt  reports pain with sudden movements/position changes --Rest/ice/heat/warm bath/Tylenol/pregnancy support belt  Preterm labor symptoms and general obstetric precautions including but not limited to vaginal bleeding, contractions, leaking of fluid and fetal movement were reviewed in detail with the patient.  I discussed the assessment and treatment plan with the patient. The patient was provided an opportunity to ask questions and all were answered. The patient agreed with the plan and demonstrated an understanding of the instructions. The patient was advised to call back or seek an in-person office evaluation/go to [facility] at [facility] for any urgent or concerning symptoms. Please refer to After Visit Summary for other counseling recommendations.   I provided 10 minutes of face-to-face time during this encounter.  Return in about 4 weeks (around 03/22/2020).  No future appointments.  Sharen Counter, CNM [facility] [employer], [facility]

## 2020-02-23 NOTE — Patient Instructions (Signed)

## 2020-03-21 ENCOUNTER — Other Ambulatory Visit: Payer: Self-pay

## 2020-03-21 ENCOUNTER — Ambulatory Visit (INDEPENDENT_AMBULATORY_CARE_PROVIDER_SITE_OTHER): Payer: No Typology Code available for payment source | Admitting: Obstetrics & Gynecology

## 2020-03-21 VITALS — BP 103/62 | HR 67 | Temp 98.4°F | Wt 136.0 lb

## 2020-03-21 DIAGNOSIS — Z3480 Encounter for supervision of other normal pregnancy, unspecified trimester: Secondary | ICD-10-CM

## 2020-03-21 NOTE — Progress Notes (Signed)
   PRENATAL VISIT NOTE  Subjective:  Dana Eaton is a 34 y.o. G2P1001 at [redacted]w[redacted]d being seen today for ongoing prenatal care.  She is currently monitored for the following issues for this low-risk pregnancy and has Family history of osteoporosis; Diastasis recti; Migraine without aura and without status migrainosus, not intractable; and Supervision of other normal pregnancy, antepartum on their problem list.  Patient reports no complaints.  Denies leaking of fluid.   The following portions of the patient's history were reviewed and updated as appropriate: allergies, current medications, past family history, past medical history, past social history, past surgical history and problem list.   Objective:  There were no vitals filed for this visit.  Fetal Status:           General:  Alert, oriented and cooperative. Patient is in no acute distress.  Skin: Skin is warm and dry. No rash noted.   Cardiovascular: Normal heart rate noted  Respiratory: Normal respiratory effort, no problems with respiration noted  Abdomen: Soft, gravid, appropriate for gestational age.        Pelvic: Cervical exam deferred        Extremities: Normal range of motion.     Mental Status: Normal mood and affect. Normal behavior. Normal judgment and thought content.   Assessment and Plan:  Pregnancy: G2P1001 at [redacted]w[redacted]d 1. Supervision of other normal pregnancy, antepartum - 2Hr GTT w/ 1 Hr Carpenter 75 g - HIV antibody (with reflex) - CBC - RPR - TDAP next visit  2.  History of Giardias -Pt not having diarrhea and and questons about recent infection.  She denies blood in her stool and is unsure if there is mucous.  She will contact her PCP to discuss symptoms.    Preterm labor symptoms and general obstetric precautions including but not limited to vaginal bleeding, contractions, leaking of fluid and fetal movement were reviewed in detail with the patient. Please refer to After Visit Summary for other counseling  recommendations.   Return in about 5 weeks (around 04/25/2020) for Routine OB.  Future Appointments  Date Time Provider Department Center  03/21/2020  9:00 AM Penne Lash, Fredrich Romans, MD [facility]    Elsie Lincoln, MD

## 2020-03-22 LAB — 2HR GTT W 1 HR, CARPENTER, 75 G
Glucose, 1 Hr, Gest: 89 mg/dL (ref 65–179)
Glucose, 2 Hr, Gest: 91 mg/dL (ref 65–152)
Glucose, Fasting, Gest: 74 mg/dL (ref 65–91)

## 2020-03-22 LAB — CBC
HCT: 33.4 % — ABNORMAL LOW (ref 35.0–45.0)
Hemoglobin: 11.3 g/dL — ABNORMAL LOW (ref 11.7–15.5)
MCH: 32.3 pg (ref 27.0–33.0)
MCHC: 33.8 g/dL (ref 32.0–36.0)
MCV: 95.4 fL (ref 80.0–100.0)
MPV: 11.6 fL (ref 7.5–12.5)
Platelets: 218 10*3/uL (ref 140–400)
RBC: 3.5 10*6/uL — ABNORMAL LOW (ref 3.80–5.10)
RDW: 11.7 % (ref 11.0–15.0)
WBC: 10.2 10*3/uL (ref 3.8–10.8)

## 2020-03-22 LAB — HIV ANTIBODY (ROUTINE TESTING W REFLEX): HIV 1&2 Ab, 4th Generation: NONREACTIVE

## 2020-03-22 LAB — RPR: RPR Ser Ql: NONREACTIVE

## 2020-03-31 ENCOUNTER — Telehealth: Payer: Self-pay | Admitting: General Practice

## 2020-03-31 ENCOUNTER — Telehealth (INDEPENDENT_AMBULATORY_CARE_PROVIDER_SITE_OTHER): Payer: No Typology Code available for payment source | Admitting: Osteopathic Medicine

## 2020-03-31 ENCOUNTER — Encounter: Payer: Self-pay | Admitting: Osteopathic Medicine

## 2020-03-31 VITALS — BP 113/57 | HR 84 | Wt 136.4 lb

## 2020-03-31 DIAGNOSIS — Z8619 Personal history of other infectious and parasitic diseases: Secondary | ICD-10-CM | POA: Diagnosis not present

## 2020-03-31 DIAGNOSIS — A071 Giardiasis [lambliasis]: Secondary | ICD-10-CM | POA: Diagnosis not present

## 2020-03-31 NOTE — Telephone Encounter (Signed)
Called patient, no answer. Left VM advising that before we can order anything, she MUST establish with new PCP. Advised her we cannot order things without seeing her since she does not currently have PCP here. Charley removed from PCP on chart. I advised patient to call and schedule with new provider, told her this can be done virtually.   FYI to front office staff in case you get call back from patient concerning this.

## 2020-03-31 NOTE — Progress Notes (Signed)
Virtual Visit via Video (App used: MyChart) Note  I connected with      Dana Eaton on 03/31/20 at 2:28 PM  by a telemedicine application and verified that I am speaking with the correct person using two identifiers.  Patient is at home I am in office   I discussed the limitations of evaluation and management by telemedicine and the availability of in person appointments. The patient expressed understanding and agreed to proceed.  History of Present Illness: Dana Eaton is a 34 y.o. female who would like to discuss stool testing for giardia follow-up.   Concerned about mucus-looking stool and would like re-tested again for Giardia.    Concerned about rash on the arms, itching, thinks due to recent heat exposure and self-tanner application past week.   (+)pregnancy at 26 weeks approx.         Observations/Objective: BP (!) 113/57   Pulse 84   Wt 136 lb 6.4 oz (61.9 kg)   LMP 10/05/2019   BMI 23.41 kg/m  BP Readings from Last 3 Encounters:  03/31/20 (!) 113/57  03/21/20 103/62  02/23/20 114/60   Exam: Normal Speech.  NAD  Lab and Radiology Results No results found for this or any previous visit (from the past 72 hour(s)). No results found.     Assessment and Plan: 34 y.o. female with The primary encounter diagnosis was History of giardia infection. A diagnosis of Giardia was also pertinent to this visit.   PDMP not reviewed this encounter. Orders Placed This Encounter  Procedures  . Giardia Antigen (Stool)  . Ova and parasite examination  . Gastrointestinal Panel by PCR , Stool   No orders of the defined types were placed in this encounter.    Follow Up Instructions: Return if symptoms worsen or fail to improve - will refer to GI.    I discussed the assessment and treatment plan with the patient. The patient was provided an opportunity to ask questions and all were answered. The patient agreed with the plan and demonstrated an  understanding of the instructions.   The patient was advised to call back or seek an in-person evaluation if any new concerns, if symptoms worsen or if the condition fails to improve as anticipated.  30 minutes of non-face-to-face time was provided during this encounter.      . . . . . . . . . . . . . Marland Kitchen                   Historical information moved to improve visibility of documentation.  Past Medical History:  Diagnosis Date  . Anemia   . History of intestinal parasite    Past Surgical History:  Procedure Laterality Date  . MANDIBLE SURGERY     Social History   Tobacco Use  . Smoking status: Never Smoker  . Smokeless tobacco: Never Used  . Tobacco comment: tried it once before-smoking   Substance Use Topics  . Alcohol use: Not Currently    Alcohol/week: 0.0 standard drinks   family history includes Colon cancer in her paternal grandmother; Osteoarthritis in her maternal grandmother; Osteoporosis in her maternal grandmother; Pancreatic cancer (age of onset: 3) in her maternal grandmother; Seizures in her father and sister.  Medications: Current Outpatient Medications  Medication Sig Dispense Refill  . Lactobacillus (CULTURELLE DIGESTIVE WOMENS) CAPS Take 1 capsule by mouth daily.    . Prenatal Vit-Fe Fumarate-FA (PRENATAL MULTIVITAMIN) TABS tablet Take 1 tablet by mouth daily at  12 noon.    . ondansetron (ZOFRAN ODT) 4 MG disintegrating tablet Take 1 tablet (4 mg total) by mouth every 6 (six) hours as needed for nausea or vomiting. (Patient not taking: Reported on 01/18/2020) 20 tablet 0   No current facility-administered medications for this visit.   No Known Allergies

## 2020-03-31 NOTE — Telephone Encounter (Signed)
PT called into the office requesting a "stool sample kit". I advised her to be established with another provider before we do that.  "I only want the stool sample kit I have nothing to follow up on."  " [facility] advised me to order one through my PCP."  Please advise.

## 2020-04-07 LAB — GIARDIA ANTIGEN
MICRO NUMBER:: 10438018
RESULT:: NOT DETECTED
SPECIMEN QUALITY:: ADEQUATE

## 2020-04-07 LAB — OVA AND PARASITE EXAMINATION
CONCENTRATE RESULT:: NONE SEEN
MICRO NUMBER:: 10438129
SPECIMEN QUALITY:: ADEQUATE
TRICHROME RESULT:: NONE SEEN

## 2020-04-11 ENCOUNTER — Other Ambulatory Visit: Payer: Self-pay | Admitting: *Deleted

## 2020-04-11 ENCOUNTER — Ambulatory Visit: Payer: No Typology Code available for payment source | Admitting: Osteopathic Medicine

## 2020-04-11 MED ORDER — DOCUSATE SODIUM 100 MG PO CAPS: 100.0000 mg | ORAL_CAPSULE | Freq: Two times a day (BID) | ORAL | 3 refills | Status: DC

## 2020-04-20 ENCOUNTER — Telehealth: Payer: Self-pay | Admitting: *Deleted

## 2020-04-20 NOTE — Telephone Encounter (Signed)
Returned call from 11:38 AM. Left patient a message that her OB appointment on 04/25/20 is just a routine OB appointment, there is nothing that will need to be done at this appointment that I am aware of at this time.

## 2020-04-25 ENCOUNTER — Ambulatory Visit (INDEPENDENT_AMBULATORY_CARE_PROVIDER_SITE_OTHER): Payer: No Typology Code available for payment source | Admitting: Obstetrics & Gynecology

## 2020-04-25 ENCOUNTER — Other Ambulatory Visit: Payer: Self-pay

## 2020-04-25 DIAGNOSIS — Z3A30 30 weeks gestation of pregnancy: Secondary | ICD-10-CM

## 2020-04-25 DIAGNOSIS — Z3480 Encounter for supervision of other normal pregnancy, unspecified trimester: Secondary | ICD-10-CM

## 2020-04-25 DIAGNOSIS — Z23 Encounter for immunization: Secondary | ICD-10-CM | POA: Diagnosis not present

## 2020-04-25 NOTE — Progress Notes (Signed)
   PRENATAL VISIT NOTE  Subjective:  Dana Eaton is a 34 y.o. G2P1001 at [redacted]w[redacted]d being seen today for ongoing prenatal care.  She is currently monitored for the following issues for this low-risk pregnancy and has Family history of osteoporosis; Diastasis recti; Migraine without aura and without status migrainosus, not intractable; and Supervision of other normal pregnancy, antepartum on their problem list.  Patient reports no complaints.  Contractions: Not present. Vag. Bleeding: None.  Movement: Present. Denies leaking of fluid.   The following portions of the patient's history were reviewed and updated as appropriate: allergies, current medications, past family history, past medical history, past social history, past surgical history and problem list.   Objective:   Vitals:   04/25/20 0901  BP: (!) 94/58  Pulse: 81  Weight: 143 lb (64.9 kg)    Fetal Status: Fetal Heart Rate (bpm): 143   Movement: Present     General:  Alert, oriented and cooperative. Patient is in no acute distress.  Skin: Skin is warm and dry. No rash noted.   Cardiovascular: Normal heart rate noted  Respiratory: Normal respiratory effort, no problems with respiration noted  Abdomen: Soft, gravid, appropriate for gestational age.  Pain/Pressure: Present     Pelvic: Cervical exam deferred        Extremities: Normal range of motion.  Edema: None  Mental Status: Normal mood and affect. Normal behavior. Normal judgment and thought content.   Assessment and Plan:  Pregnancy: G2P1001 at [redacted]w[redacted]d 1. Supervision of other normal pregnancy, antepartum Tdap GCT normal Giardia negative at PCP.  No diarrhea  Preterm labor symptoms and general obstetric precautions including but not limited to vaginal bleeding, contractions, leaking of fluid and fetal movement were reviewed in detail with the patient. Please refer to After Visit Summary for other counseling recommendations.   Return in about 3 weeks (around  05/16/2020).   Elsie Lincoln, MD

## 2020-04-25 NOTE — Addendum Note (Signed)
Addended by: Kathie Dike on: 04/25/2020 09:28 AM   Modules accepted: Orders

## 2020-05-17 ENCOUNTER — Ambulatory Visit (INDEPENDENT_AMBULATORY_CARE_PROVIDER_SITE_OTHER): Payer: No Typology Code available for payment source | Admitting: Advanced Practice Midwife

## 2020-05-17 ENCOUNTER — Other Ambulatory Visit: Payer: Self-pay

## 2020-05-17 VITALS — BP 101/63 | HR 98 | Wt 145.0 lb

## 2020-05-17 DIAGNOSIS — Z3480 Encounter for supervision of other normal pregnancy, unspecified trimester: Secondary | ICD-10-CM

## 2020-05-17 DIAGNOSIS — O479 False labor, unspecified: Secondary | ICD-10-CM

## 2020-05-17 DIAGNOSIS — Z3A33 33 weeks gestation of pregnancy: Secondary | ICD-10-CM

## 2020-05-17 DIAGNOSIS — Z3483 Encounter for supervision of other normal pregnancy, third trimester: Secondary | ICD-10-CM

## 2020-05-17 NOTE — Patient Instructions (Signed)
Third Trimester of Pregnancy The third trimester is from week 28 through week 40 (months 7 through 9). The third trimester is a time when the unborn baby (fetus) is growing rapidly. At the end of the ninth month, the fetus is about 20 inches in length and weighs 6-10 pounds. Body changes during your third trimester Your body will continue to go through many changes during pregnancy. The changes vary from woman to woman. During the third trimester:  Your weight will continue to increase. You can expect to gain 25-35 pounds (11-16 kg) by the end of the pregnancy.  You may begin to get stretch marks on your hips, abdomen, and breasts.  You may urinate more often because the fetus is moving lower into your pelvis and pressing on your bladder.  You may develop or continue to have heartburn. This is caused by increased hormones that slow down muscles in the digestive tract.  You may develop or continue to have constipation because increased hormones slow digestion and cause the muscles that push waste through your intestines to relax.  You may develop hemorrhoids. These are swollen veins (varicose veins) in the rectum that can itch or be painful.  You may develop swollen, bulging veins (varicose veins) in your legs.  You may have increased body aches in the pelvis, back, or thighs. This is due to weight gain and increased hormones that are relaxing your joints.  You may have changes in your hair. These can include thickening of your hair, rapid growth, and changes in texture. Some women also have hair loss during or after pregnancy, or hair that feels dry or thin. Your hair will most likely return to normal after your baby is born.  Your breasts will continue to grow and they will continue to become tender. A yellow fluid (colostrum) may leak from your breasts. This is the first milk you are producing for your baby.  Your belly button may stick out.  You may notice more swelling in your hands,  face, or ankles.  You may have increased tingling or numbness in your hands, arms, and legs. The skin on your belly may also feel numb.  You may feel short of breath because of your expanding uterus.  You may have more problems sleeping. This can be caused by the size of your belly, increased need to urinate, and an increase in your body's metabolism.  You may notice the fetus "dropping," or moving lower in your abdomen (lightening).  You may have increased vaginal discharge.  You may notice your joints feel loose and you may have pain around your pelvic bone. What to expect at prenatal visits You will have prenatal exams every 2 weeks until week 36. Then you will have weekly prenatal exams. During a routine prenatal visit:  You will be weighed to make sure you and the baby are growing normally.  Your blood pressure will be taken.  Your abdomen will be measured to track your baby's growth.  The fetal heartbeat will be listened to.  Any test results from the previous visit will be discussed.  You may have a cervical check near your due date to see if your cervix has softened or thinned (effaced).  You will be tested for Group B streptococcus. This happens between 35 and 37 weeks. Your health care provider may ask you:  What your birth plan is.  How you are feeling.  If you are feeling the baby move.  If you have had any abnormal   symptoms, such as leaking fluid, bleeding, severe headaches, or abdominal cramping.  If you are using any tobacco products, including cigarettes, chewing tobacco, and electronic cigarettes.  If you have any questions. Other tests or screenings that may be performed during your third trimester include:  Blood tests that check for low iron levels (anemia).  Fetal testing to check the health, activity level, and growth of the fetus. Testing is done if you have certain medical conditions or if there are problems during the pregnancy.  Nonstress test  (NST). This test checks the health of your baby to make sure there are no signs of problems, such as the baby not getting enough oxygen. During this test, a belt is placed around your belly. The baby is made to move, and its heart rate is monitored during movement. What is false labor? False labor is a condition in which you feel small, irregular tightenings of the muscles in the womb (contractions) that usually go away with rest, changing position, or drinking water. These are called Braxton Hicks contractions. Contractions may last for hours, days, or even weeks before true labor sets in. If contractions come at regular intervals, become more frequent, increase in intensity, or become painful, you should see your health care provider. What are the signs of labor?  Abdominal cramps.  Regular contractions that start at 10 minutes apart and become stronger and more frequent with time.  Contractions that start on the top of the uterus and spread down to the lower abdomen and back.  Increased pelvic pressure and dull back pain.  A watery or bloody mucus discharge that comes from the vagina.  Leaking of amniotic fluid. This is also known as your "water breaking." It could be a slow trickle or a gush. Let your health care provider know if it has a color or strange odor. If you have any of these signs, call your health care provider right away, even if it is before your due date. Follow these instructions at home: Medicines  Follow your health care provider's instructions regarding medicine use. Specific medicines may be either safe or unsafe to take during pregnancy.  Take a prenatal vitamin that contains at least 600 micrograms (mcg) of folic acid.  If you develop constipation, try taking a stool softener if your health care provider approves. Eating and drinking   Eat a balanced diet that includes fresh fruits and vegetables, whole grains, good sources of protein such as meat, eggs, or tofu,  and low-fat dairy. Your health care provider will help you determine the amount of weight gain that is right for you.  Avoid raw meat and uncooked cheese. These carry germs that can cause birth defects in the baby.  If you have low calcium intake from food, talk to your health care provider about whether you should take a daily calcium supplement.  Eat four or five small meals rather than three large meals a day.  Limit foods that are high in fat and processed sugars, such as fried and sweet foods.  To prevent constipation: ? Drink enough fluid to keep your urine clear or pale yellow. ? Eat foods that are high in fiber, such as fresh fruits and vegetables, whole grains, and beans. Activity  Exercise only as directed by your health care provider. Most women can continue their usual exercise routine during pregnancy. Try to exercise for 30 minutes at least 5 days a week. Stop exercising if you experience uterine contractions.  Avoid heavy lifting.  Do   not exercise in extreme heat or humidity, or at high altitudes.  Wear low-heel, comfortable shoes.  Practice good posture.  You may continue to have sex unless your health care provider tells you otherwise. Relieving pain and discomfort  Take frequent breaks and rest with your legs elevated if you have leg cramps or low back pain.  Take warm sitz baths to soothe any pain or discomfort caused by hemorrhoids. Use hemorrhoid cream if your health care provider approves.  Wear a good support bra to prevent discomfort from breast tenderness.  If you develop varicose veins: ? Wear support pantyhose or compression stockings as told by your healthcare provider. ? Elevate your feet for 15 minutes, 3-4 times a day. Prenatal care  Write down your questions. Take them to your prenatal visits.  Keep all your prenatal visits as told by your health care provider. This is important. Safety  Wear your seat belt at all times when driving.  Make  a list of emergency phone numbers, including numbers for family, friends, the hospital, and police and fire departments. General instructions  Avoid cat litter boxes and soil used by cats. These carry germs that can cause birth defects in the baby. If you have a cat, ask someone to clean the litter box for you.  Do not travel far distances unless it is absolutely necessary and only with the approval of your health care provider.  Do not use hot tubs, steam rooms, or saunas.  Do not drink alcohol.  Do not use any products that contain nicotine or tobacco, such as cigarettes and e-cigarettes. If you need help quitting, ask your health care provider.  Do not use any medicinal herbs or unprescribed drugs. These chemicals affect the formation and growth of the baby.  Do not douche or use tampons or scented sanitary pads.  Do not cross your legs for long periods of time.  To prepare for the arrival of your baby: ? Take prenatal classes to understand, practice, and ask questions about labor and delivery. ? Make a trial run to the hospital. ? Visit the hospital and tour the maternity area. ? Arrange for maternity or paternity leave through employers. ? Arrange for family and friends to take care of pets while you are in the hospital. ? Purchase a rear-facing car seat and make sure you know how to install it in your car. ? Pack your hospital bag. ? Prepare the baby's nursery. Make sure to remove all pillows and stuffed animals from the baby's crib to prevent suffocation.  Visit your dentist if you have not gone during your pregnancy. Use a soft toothbrush to brush your teeth and be gentle when you floss. Contact a health care provider if:  You are unsure if you are in labor or if your water has broken.  You become dizzy.  You have mild pelvic cramps, pelvic pressure, or nagging pain in your abdominal area.  You have lower back pain.  You have persistent nausea, vomiting, or  diarrhea.  You have an unusual or bad smelling vaginal discharge.  You have pain when you urinate. Get help right away if:  Your water breaks before 37 weeks.  You have regular contractions less than 5 minutes apart before 37 weeks.  You have a fever.  You are leaking fluid from your vagina.  You have spotting or bleeding from your vagina.  You have severe abdominal pain or cramping.  You have rapid weight loss or weight gain.  You have   shortness of breath with chest pain.  You notice sudden or extreme swelling of your face, hands, ankles, feet, or legs.  Your baby makes fewer than 10 movements in 2 hours.  You have severe headaches that do not go away when you take medicine.  You have vision changes. Summary  The third trimester is from week 28 through week 40, months 7 through 9. The third trimester is a time when the unborn baby (fetus) is growing rapidly.  During the third trimester, your discomfort may increase as you and your baby continue to gain weight. You may have abdominal, leg, and back pain, sleeping problems, and an increased need to urinate.  During the third trimester your breasts will keep growing and they will continue to become tender. A yellow fluid (colostrum) may leak from your breasts. This is the first milk you are producing for your baby.  False labor is a condition in which you feel small, irregular tightenings of the muscles in the womb (contractions) that eventually go away. These are called Braxton Hicks contractions. Contractions may last for hours, days, or even weeks before true labor sets in.  Signs of labor can include: abdominal cramps; regular contractions that start at 10 minutes apart and become stronger and more frequent with time; watery or bloody mucus discharge that comes from the vagina; increased pelvic pressure and dull back pain; and leaking of amniotic fluid. This information is not intended to replace advice given to you by your  health care provider. Make sure you discuss any questions you have with your health care provider. Document Revised: 03/12/2019 Document Reviewed: 12/25/2016 [employer]vier Patient Education  2020 [employer]  

## 2020-05-17 NOTE — Progress Notes (Signed)
° °  PRENATAL VISIT NOTE  Subjective:  Dana Eaton is a 34 y.o. G2P1001 at [redacted]w[redacted]d being seen today for ongoing prenatal care.  She is currently monitored for the following issues for this low-risk pregnancy and has Family history of osteoporosis; Diastasis recti; Migraine without aura and without status migrainosus, not intractable; and Supervision of other normal pregnancy, antepartum on their problem list.  Patient reports occasional contractions.  Contractions: Not present. Vag. Bleeding: None.  Movement: Present. Denies leaking of fluid.   The following portions of the patient's history were reviewed and updated as appropriate: allergies, current medications, past family history, past medical history, past social history, past surgical history and problem list.   Objective:   Vitals:   05/17/20 1324  BP: 101/63  Pulse: 98  Weight: 145 lb (65.8 kg)    Fetal Status: Fetal Heart Rate (bpm): 145 Fundal Height: 33 cm Movement: Present  Presentation: Vertex  General:  Alert, oriented and cooperative. Patient is in no acute distress.  Skin: Skin is warm and dry. No rash noted.   Cardiovascular: Normal heart rate noted  Respiratory: Normal respiratory effort, no problems with respiration noted  Abdomen: Soft, gravid, appropriate for gestational age.  Pain/Pressure: Present     Pelvic: Cervical exam performed in the presence of a chaperone Dilation: 1 Effacement (%): 50 Station: -3  Extremities: Normal range of motion.  Edema: None  Mental Status: Normal mood and affect. Normal behavior. Normal judgment and thought content.   Assessment and Plan:  Pregnancy: G2P1001 at [redacted]w[redacted]d 1. Supervision of other normal pregnancy, antepartum --Anticipatory guidance about next visits/weeks of pregnancy given. --EFW by Leopolds 5.5 lbs, fundal height 33 cm.  --Next visit in office in 3 weeks for GBS  2. Braxton Hicks contractions --Pt with episodes of contractions 5-6 times per hour, then they  resolve and are less frequent.   --Cervix 1/50/-3, very posterior, medium/firm on exam.  No evidence of active PTL but precautions reviewed/reasons to seek care.  Preterm labor symptoms and general obstetric precautions including but not limited to vaginal bleeding, contractions, leaking of fluid and fetal movement were reviewed in detail with the patient. Please refer to After Visit Summary for other counseling recommendations.   Return in about 3 weeks (around 06/07/2020).  Future Appointments  Date Time Provider Department Center  06/07/2020  2:40 PM Leftwich-Kirby, Wilmer Floor, CNM [facility]    Sharen Counter, CNM

## 2020-06-01 ENCOUNTER — Other Ambulatory Visit: Payer: Self-pay | Admitting: *Deleted

## 2020-06-01 MED ORDER — BREAST PUMP MISC: 1.0000 [IU] | 0 refills | Status: DC | PRN

## 2020-06-07 ENCOUNTER — Ambulatory Visit (INDEPENDENT_AMBULATORY_CARE_PROVIDER_SITE_OTHER): Payer: No Typology Code available for payment source | Admitting: Advanced Practice Midwife

## 2020-06-07 ENCOUNTER — Other Ambulatory Visit: Payer: Self-pay

## 2020-06-07 ENCOUNTER — Other Ambulatory Visit (HOSPITAL_COMMUNITY)
Admission: RE | Admit: 2020-06-07 | Discharge: 2020-06-07 | Disposition: A | Discharge status: Home / Self Care | Payer: No Typology Code available for payment source | Source: Ambulatory Visit | Attending: Advanced Practice Midwife | Admitting: Advanced Practice Midwife

## 2020-06-07 VITALS — BP 113/72 | HR 94 | Wt 150.0 lb

## 2020-06-07 DIAGNOSIS — Z3480 Encounter for supervision of other normal pregnancy, unspecified trimester: Secondary | ICD-10-CM

## 2020-06-07 DIAGNOSIS — Z3483 Encounter for supervision of other normal pregnancy, third trimester: Secondary | ICD-10-CM

## 2020-06-07 DIAGNOSIS — Z3A36 36 weeks gestation of pregnancy: Secondary | ICD-10-CM

## 2020-06-07 NOTE — Progress Notes (Signed)
° °  PRENATAL VISIT NOTE  Subjective:  Dana Eaton is a 34 y.o. G2P1001 at [redacted]w[redacted]d being seen today for ongoing prenatal care.  She is currently monitored for the following issues for this low-risk pregnancy and has Family history of osteoporosis; Diastasis recti; Migraine without aura and without status migrainosus, not intractable; and Supervision of other normal pregnancy, antepartum on their problem list.  Patient reports occasional contractions.  Contractions: Irritability. Vag. Bleeding: None.  Movement: Present. Denies leaking of fluid.   The following portions of the patient's history were reviewed and updated as appropriate: allergies, current medications, past family history, past medical history, past social history, past surgical history and problem list.   Objective:   Vitals:   06/07/20 1441  BP: 113/72  Pulse: 94  Weight: 150 lb (68 kg)    Fetal Status: Fetal Heart Rate (bpm): 148 Fundal Height: 36 cm Movement: Present  Presentation: Vertex  General:  Alert, oriented and cooperative. Patient is in no acute distress.  Skin: Skin is warm and dry. No rash noted.   Cardiovascular: Normal heart rate noted  Respiratory: Normal respiratory effort, no problems with respiration noted  Abdomen: Soft, gravid, appropriate for gestational age.  Pain/Pressure: Present     Pelvic: Cervical exam performed in the presence of a chaperone Dilation: 2.5 Effacement (%): 60 Station: -2  Extremities: Normal range of motion.  Edema: None  Mental Status: Normal mood and affect. Normal behavior. Normal judgment and thought content.   Assessment and Plan:  Pregnancy: G2P1001 at [redacted]w[redacted]d 1. Supervision of other normal pregnancy, antepartum --Anticipatory guidance about next visits/weeks of pregnancy given. --Cervical exam at pt request --Next visit in 1 week in office  - Culture, beta strep (group b only) - Cervicovaginal ancillary only( [place])  Term labor symptoms and general  obstetric precautions including but not limited to vaginal bleeding, contractions, leaking of fluid and fetal movement were reviewed in detail with the patient. Please refer to After Visit Summary for other counseling recommendations.   No follow-ups on file.  Future Appointments  Date Time Provider Department Center  06/20/2020  3:45 PM Conan Bowens, MD [facility]    Sharen Counter, CNM

## 2020-06-08 LAB — CERVICOVAGINAL ANCILLARY ONLY
Chlamydia: NEGATIVE
Comment: NEGATIVE
Comment: NORMAL
Neisseria Gonorrhea: NEGATIVE

## 2020-06-10 LAB — OB RESULTS CONSOLE GBS: GBS: NEGATIVE

## 2020-06-10 LAB — CULTURE, BETA STREP (GROUP B ONLY)
MICRO NUMBER:: 10672869
SPECIMEN QUALITY:: ADEQUATE

## 2020-06-20 ENCOUNTER — Encounter: Payer: Self-pay | Admitting: Obstetrics and Gynecology

## 2020-06-20 ENCOUNTER — Other Ambulatory Visit: Payer: Self-pay

## 2020-06-20 ENCOUNTER — Ambulatory Visit (INDEPENDENT_AMBULATORY_CARE_PROVIDER_SITE_OTHER): Payer: No Typology Code available for payment source | Admitting: Obstetrics and Gynecology

## 2020-06-20 VITALS — BP 103/60 | HR 92 | Wt 154.0 lb

## 2020-06-20 DIAGNOSIS — Z3A38 38 weeks gestation of pregnancy: Secondary | ICD-10-CM

## 2020-06-20 DIAGNOSIS — Z3480 Encounter for supervision of other normal pregnancy, unspecified trimester: Secondary | ICD-10-CM

## 2020-06-20 DIAGNOSIS — Z3483 Encounter for supervision of other normal pregnancy, third trimester: Secondary | ICD-10-CM

## 2020-06-20 NOTE — Progress Notes (Signed)
   PRENATAL VISIT NOTE  Subjective:  Dana Eaton is a 34 y.o. G2P1001 at [redacted]w[redacted]d being seen today for ongoing prenatal care.  She is currently monitored for the following issues for this low-risk pregnancy and has Family history of osteoporosis; Diastasis recti; Migraine without aura and without status migrainosus, not intractable; and Supervision of other normal pregnancy, antepartum on their problem list.  Patient reports occasional contractions.  Contractions: Irritability. Vag. Bleeding: None.  Movement: Present. Denies leaking of fluid.   The following portions of the patient's history were reviewed and updated as appropriate: allergies, current medications, past family history, past medical history, past social history, past surgical history and problem list.   Objective:   Vitals:   06/20/20 1552  BP: 103/60  Pulse: 92  Weight: 154 lb (69.9 kg)    Fetal Status: Fetal Heart Rate (bpm): 143   Movement: Present     General:  Alert, oriented and cooperative. Patient is in no acute distress.  Skin: Skin is warm and dry. No rash noted.   Cardiovascular: Normal heart rate noted  Respiratory: Normal respiratory effort, no problems with respiration noted  Abdomen: Soft, gravid, appropriate for gestational age.  Pain/Pressure: Present     Pelvic: Cervical exam performed in the presence of a chaperone      3/70/+1  Extremities: Normal range of motion.  Edema: Trace  Mental Status: Normal mood and affect. Normal behavior. Normal judgment and thought content.   Assessment and Plan:  Pregnancy: G2P1001 at [redacted]w[redacted]d  1. Supervision of other normal pregnancy, antepartum IOL 41 weeks  Term labor symptoms and general obstetric precautions including but not limited to vaginal bleeding, contractions, leaking of fluid and fetal movement were reviewed in detail with the patient. Please refer to After Visit Summary for other counseling recommendations.   Return in about 1 week (around  06/27/2020) for low OB, in person.  No future appointments.  Conan Bowens, MD

## 2020-06-28 ENCOUNTER — Other Ambulatory Visit: Payer: Self-pay

## 2020-06-28 ENCOUNTER — Ambulatory Visit (INDEPENDENT_AMBULATORY_CARE_PROVIDER_SITE_OTHER): Payer: No Typology Code available for payment source | Admitting: Advanced Practice Midwife

## 2020-06-28 VITALS — BP 108/67 | HR 88 | Wt 156.0 lb

## 2020-06-28 DIAGNOSIS — R600 Localized edema: Secondary | ICD-10-CM

## 2020-06-28 DIAGNOSIS — Z3A39 39 weeks gestation of pregnancy: Secondary | ICD-10-CM

## 2020-06-28 DIAGNOSIS — Z3480 Encounter for supervision of other normal pregnancy, unspecified trimester: Secondary | ICD-10-CM

## 2020-06-28 DIAGNOSIS — Z3483 Encounter for supervision of other normal pregnancy, third trimester: Secondary | ICD-10-CM

## 2020-06-28 NOTE — Progress Notes (Signed)
   PRENATAL VISIT NOTE  Subjective:  Dana Eaton is a 34 y.o. G2P1001 at [redacted]w[redacted]d being seen today for ongoing prenatal care.  She is currently monitored for the following issues for this low-risk pregnancy and has Family history of osteoporosis; Diastasis recti; Migraine without aura and without status migrainosus, not intractable; and Supervision of other normal pregnancy, antepartum on their problem list.  Patient reports occasional contractions.  Contractions: Irritability. Vag. Bleeding: None.  Movement: Present. Denies leaking of fluid.   The following portions of the patient's history were reviewed and updated as appropriate: allergies, current medications, past family history, past medical history, past social history, past surgical history and problem list.   Objective:   Vitals:   06/28/20 0913  BP: 108/67  Pulse: 88  Weight: 156 lb (70.8 kg)    Fetal Status: Fetal Heart Rate (bpm): 141 Fundal Height: 38 cm Movement: Present  Presentation: Vertex  General:  Alert, oriented and cooperative. Patient is in no acute distress.  Skin: Skin is warm and dry. No rash noted.   Cardiovascular: Normal heart rate noted  Respiratory: Normal respiratory effort, no problems with respiration noted  Abdomen: Soft, gravid, appropriate for gestational age.  Pain/Pressure: Present     Pelvic: Cervical exam performed in the presence of a chaperone Dilation: 4 Effacement (%): 80 Station: -2  Extremities: Normal range of motion.  Edema: Trace  Mental Status: Normal mood and affect. Normal behavior. Normal judgment and thought content.   Assessment and Plan:  Pregnancy: G2P1001 at [redacted]w[redacted]d 1. Supervision of other normal pregnancy, antepartum --Anticipatory guidance about next visits/weeks of pregnancy given. --Next visit in 1 week --EFW 7lbs by Leopolds today, baby is vertex, ROP position.  Pt to try the [place] Circuit to encourage optimal fetal position/onset of labor. --Cervix 4/80/-2.  Some  irregular cramping Q 30-45 minutes. Reviewed term labor precautions.   2. Edema of right foot --Swelling of BLE with more on the right.  +2-3 nonpitting edema of right lower extremity, +1 on left.  Calves equal in size, no redness, no warmth on exam. Negative Homan's sign.  Unlikely DVT, but warning signs/reasons to seek care reviewed. --Elevate, increase PO fluids, decrease sodium. Pt may borrow support socks from her mother to try.    Term labor symptoms and general obstetric precautions including but not limited to vaginal bleeding, contractions, leaking of fluid and fetal movement were reviewed in detail with the patient. Please refer to After Visit Summary for other counseling recommendations.   Return in about 1 week (around 07/05/2020).  No future appointments.  Sharen Counter, CNM

## 2020-06-28 NOTE — Progress Notes (Signed)
Pt c/o right foot swollen

## 2020-06-28 NOTE — Patient Instructions (Signed)
Labor Precautions Reasons to come to MAU at [facility]:  1.  Contractions are  5 minutes apart or less, each last 1 minute, these have been going on for 1-2 hours, and you cannot walk or talk during them 2.  You have a large gush of fluid, or a trickle of fluid that will not stop and you have to wear a pad 3.  You have bleeding that is bright red, heavier than spotting--like menstrual bleeding (spotting can be normal in early labor or after a check of your cervix) 4.  You do not feel the baby moving like he/she normally does  Things to Try After 37 weeks to Encourage Labor/Get Ready for Labor:   1.  Try the [place] Circuit at www.milescircuit.com daily to improve baby's position and encourage the onset of labor.  2. Walk a little and rest a little every day.  Change positions often.  3. Cervical Ripening: May try one or both a. Red Raspberry Leaf capsules or tea:  two 300mg or 400mg tablets with each meal, 2-3 times a day, or 1-3 cups of tea daily  Potential Side Effects Of Raspberry Leaf:  Most women do not experience any side effects from drinking raspberry leaf tea. However, nausea and loose stools are possible   b. Evening Primrose Oil capsules: may take 1 to 3 capsules daily. Take 1-2 capsules by mouth each day and place one capsule vaginally at night.  You may also prick the vaginal capsule to release the oil prior to inserting in the vagina. Some of the potential side effects:  Upset stomach  Loose stools or diarrhea  Headaches  Nausea  4. Sex (and especially sex with orgasm) can also help the cervix ripen and encourage labor onset.   

## 2020-06-29 ENCOUNTER — Telehealth: Payer: Self-pay

## 2020-06-29 ENCOUNTER — Inpatient Hospital Stay (HOSPITAL_COMMUNITY)
Admission: AD | Admit: 2020-06-29 | Discharge: 2020-06-29 | Disposition: A | Discharge status: Home / Self Care | Payer: No Typology Code available for payment source | Attending: Obstetrics and Gynecology | Admitting: Obstetrics and Gynecology

## 2020-06-29 ENCOUNTER — Other Ambulatory Visit: Payer: Self-pay

## 2020-06-29 ENCOUNTER — Other Ambulatory Visit: Payer: Self-pay | Admitting: Student

## 2020-06-29 DIAGNOSIS — N390 Urinary tract infection, site not specified: Secondary | ICD-10-CM

## 2020-06-29 DIAGNOSIS — O26893 Other specified pregnancy related conditions, third trimester: Secondary | ICD-10-CM | POA: Diagnosis not present

## 2020-06-29 DIAGNOSIS — R39198 Other difficulties with micturition: Secondary | ICD-10-CM | POA: Insufficient documentation

## 2020-06-29 DIAGNOSIS — Z3A39 39 weeks gestation of pregnancy: Secondary | ICD-10-CM | POA: Insufficient documentation

## 2020-06-29 DIAGNOSIS — O2343 Unspecified infection of urinary tract in pregnancy, third trimester: Secondary | ICD-10-CM

## 2020-06-29 LAB — URINALYSIS, ROUTINE W REFLEX MICROSCOPIC
Bilirubin Urine: NEGATIVE
Glucose, UA: NEGATIVE mg/dL
Hgb urine dipstick: NEGATIVE
Ketones, ur: NEGATIVE mg/dL
Nitrite: NEGATIVE
Protein, ur: NEGATIVE mg/dL
Specific Gravity, Urine: 1.005 (ref 1.005–1.030)
pH: 6 (ref 5.0–8.0)

## 2020-06-29 MED ORDER — CEPHALEXIN 500 MG PO CAPS: 500.0000 mg | ORAL_CAPSULE | Freq: Three times a day (TID) | ORAL | 0 refills | Status: DC

## 2020-06-29 NOTE — MAU Provider Note (Signed)
Patient Dana Eaton is a 34 y.o. G2P1001  at [redacted]w[redacted]d here with complaints of pink blood on her toilet paper and some burning at the end of urination. She reports that she started feeling this last night. She called her clinic ([facility]) and was told to come here by staff because we could "get results back in an hour".    She denies vaginal bleeding, LOF, decreased fetal movements, contractions.   Patient Vitals for the past 24 hrs:  BP Temp Pulse Resp SpO2  06/29/20 1128 112/74 98.2 F (36.8 C) 102 16 100 %    UA shows large leuks  Quick physical exam is negative for CVA tenderness, abdomen is soft, non-tender. She is alert and oriented times 3 in no distress.   Will start on Keflex prophylactically and send urine for culture  Patient agreeable to plan of care; keep appt next week.      Charlesetta Garibaldi Daizha Anand 06/29/2020, 5:24 PM

## 2020-06-29 NOTE — Discharge Instructions (Signed)
Pregnancy and Urinary Tract Infection ° °A urinary tract infection (UTI) is an infection of any part of the urinary tract. This includes the kidneys, the tubes that connect your kidneys to your bladder (ureters), the bladder, and the tube that carries urine out of your body (urethra). These organs make, store, and get rid of urine in the body. Your health care provider may use other names to describe the infection. An upper UTI affects the ureters and kidneys (pyelonephritis). A lower UTI affects the bladder (cystitis) and urethra (urethritis). °Most urinary tract infections are caused by bacteria in your genital area, around the entrance to your urinary tract (urethra). These bacteria grow and cause irritation and inflammation of your urinary tract. You are more likely to develop a UTI during pregnancy because the physical and hormonal changes your body goes through can make it easier for bacteria to get into your urinary tract. Your growing baby also puts pressure on your bladder and can affect urine flow. It is important to recognize and treat UTIs in pregnancy because of the risk of serious complications for both you and your baby. °How does this affect me? °Symptoms of a UTI include: °· Needing to urinate right away (urgently). °· Frequent urination or passing small amounts of urine frequently. °· Pain or burning with urination. °· Blood in the urine. °· Urine that smells bad or unusual. °· Trouble urinating. °· Cloudy urine. °· Pain in the abdomen or lower back. °· Vaginal discharge. °You may also have: °· Vomiting or a decreased appetite. °· Confusion. °· Irritability or tiredness. °· A fever. °· Diarrhea. °How does this affect my baby? °An untreated UTI during pregnancy could lead to a kidney infection or a systemic infection, which can cause health problems that could affect your baby. Possible complications of an untreated UTI include: °· Giving birth to your baby before 37 weeks of pregnancy  (premature). °· Having a baby with a low birth weight. °· Developing high blood pressure during pregnancy (preeclampsia). °· Having a low hemoglobin level (anemia). °What can I do to lower my risk? °To prevent a UTI: °· Go to the bathroom as soon as you feel the need. Do not hold urine for long periods of time. °· Always wipe from front to back, especially after a bowel movement. Use each tissue one time when you wipe. °· Empty your bladder after sex. °· Keep your genital area dry. °· Drink 6-10 glasses of water each day. °· Do not douche or use deodorant sprays. °How is this treated? °Treatment for this condition may include: °· Antibiotic medicines that are safe to take during pregnancy. °· Other medicines to treat less common causes of UTI. °Follow these instructions at home: °· If you were prescribed an antibiotic medicine, take it as told by your health care provider. Do not stop using the antibiotic even if you start to feel better. °· Keep all follow-up visits as told by your health care provider. This is important. °Contact a health care provider if: °· Your symptoms do not improve or they get worse. °· You have abnormal vaginal discharge. °Get help right away if you: °· Have a fever. °· Have nausea and vomiting. °· Have back or side pain. °· Feel contractions in your uterus. °· Have lower belly pain. °· Have a gush of fluid from your vagina. °· Have blood in your urine. °Summary °· A urinary tract infection (UTI) is an infection of any part of the urinary tract, which includes the   kidneys, ureters, bladder, and urethra. °· Most urinary tract infections are caused by bacteria in your genital area, around the entrance to your urinary tract (urethra). °· You are more likely to develop a UTI during pregnancy. °· If you were prescribed an antibiotic medicine, take it as told by your health care provider. Do not stop using the antibiotic even if you start to feel better. °This information is not intended to  replace advice given to you by your health care provider. Make sure you discuss any questions you have with your health care provider. °Document Revised: 03/13/2019 Document Reviewed: 10/23/2018 °Elsevier Patient Education © 2020 [employer] ° °

## 2020-06-29 NOTE — Progress Notes (Signed)
Patient Dana Eaton is a 34 y.o. G2P1001  at [redacted]w[redacted]d here with complaints of red blood on her toilet paper and burning at the end of urination. She is concerned about UTI; she denies contractions, vaginal bleeding, LOF, decreased fetal movements.   She called her clinic in [place] and they told her to come here because her culture would come back within an hour.   Explained to patient that we can do a UA here, and then send for culture.   Limited physical exam shows: no abdominal tenderness, no CVA tenderness.  VSS, no fevers.   FHR is 149.   UA shows large leuks, no squamous. Will send for culture and treat with Keflex.   All questions answered.   Luna Kitchens

## 2020-06-29 NOTE — Telephone Encounter (Signed)
Pt called complaining of UTI symptoms and is concerned about receiving treatment before delivering her baby. I gave the pt the option to come to the office to drop off a urine sample and we can send it for culture. Pt was made aware that the urine culture usually takes around 48 hours to result. Pt was concerned due to her GA that she could go to deliver her baby before she would be able to have results and begin accurate treatment. Pt was also given the option to go to the MAU where she could receive much faster results and begin treatment much faster if necessary. Pt chooses to go to MAU. Pt was given address to [facility] at [facility].

## 2020-06-29 NOTE — MAU Note (Signed)
.   Dana Eaton is a 34 y.o. at [redacted]w[redacted]d here in MAU reporting: burning with urination. Denies any VB. Reports good fetal movement  Onset of complaint: couple of days  Pain score: 0 Vitals:   06/29/20 1128  BP: 112/74  Pulse: 102  Resp: 16  Temp: 98.2 F (36.8 C)  SpO2: 100%     [id number] Lab orders placed from triage: UA

## 2020-06-30 LAB — CULTURE, OB URINE: Culture: <10000 — AB

## 2020-07-06 ENCOUNTER — Ambulatory Visit (INDEPENDENT_AMBULATORY_CARE_PROVIDER_SITE_OTHER): Payer: No Typology Code available for payment source | Admitting: Obstetrics & Gynecology

## 2020-07-06 ENCOUNTER — Other Ambulatory Visit: Payer: Self-pay

## 2020-07-06 DIAGNOSIS — O480 Post-term pregnancy: Secondary | ICD-10-CM

## 2020-07-06 DIAGNOSIS — Z3483 Encounter for supervision of other normal pregnancy, third trimester: Secondary | ICD-10-CM

## 2020-07-06 DIAGNOSIS — Z3A40 40 weeks gestation of pregnancy: Secondary | ICD-10-CM

## 2020-07-06 DIAGNOSIS — Z3480 Encounter for supervision of other normal pregnancy, unspecified trimester: Secondary | ICD-10-CM

## 2020-07-06 NOTE — Progress Notes (Signed)
   PRENATAL VISIT NOTE  Subjective:  Dana Eaton is a 34 y.o. G2P1001 at [redacted]w[redacted]d being seen today for ongoing prenatal care.  She is currently monitored for the following issues for this low-risk pregnancy and has Family history of osteoporosis; Diastasis recti; Migraine without aura and without status migrainosus, not intractable; and Supervision of other normal pregnancy, antepartum on their problem list.  Patient reports pelvic pressure, head sitting on bladder making it diffcult to urinate. .  Contractions: Not present. Vag. Bleeding: None.  Movement: Present. Denies leaking of fluid.   The following portions of the patient's history were reviewed and updated as appropriate: allergies, current medications, past family history, past medical history, past social history, past surgical history and problem list.   Objective:   Vitals:   07/06/20 1311  BP: 104/64  Pulse: 81  Weight: 159 lb (72.1 kg)    Fetal Status:     Movement: Present     General:  Alert, oriented and cooperative. Patient is in no acute distress.  Skin: Skin is warm and dry. No rash noted.   Cardiovascular: Normal heart rate noted  Respiratory: Normal respiratory effort, no problems with respiration noted  Abdomen: Soft, gravid, appropriate for gestational age.  Pain/Pressure: Present     Pelvic: Cervical exam performed in the presence of a chaperone      5/100/-1  Extremities: Normal range of motion.  Edema: Mild pitting, slight indentation  Mental Status: Normal mood and affect. Normal behavior. Normal judgment and thought content.   Assessment and Plan:  Pregnancy: G2P1001 at 106w2d 1. Supervision of other normal pregnancy, antepartum Offered to induce patient given bladder symptoms and advanced cervical exam.  Pt will talk with husband and decide on induction.  NST reactive today.   Baseline:  130 Accelerations:  present Decelerations:  absent Variability:  moderate Interpretation:  Reactive  NST   Term labor symptoms and general obstetric precautions including but not limited to vaginal bleeding, contractions, leaking of fluid and fetal movement were reviewed in detail with the patient. Please refer to After Visit Summary for other counseling recommendations.   No follow-ups on file.  Future Appointments  Date Time Provider Department Center  07/06/2020  2:20 PM Lesly Dukes, MD [facility]    Elsie Lincoln, MD

## 2020-07-07 ENCOUNTER — Other Ambulatory Visit (HOSPITAL_COMMUNITY)
Admission: RE | Admit: 2020-07-07 | Discharge: 2020-07-07 | Disposition: A | Discharge status: Home / Self Care | Payer: No Typology Code available for payment source | Source: Ambulatory Visit | Attending: Family Medicine | Admitting: Family Medicine

## 2020-07-07 ENCOUNTER — Encounter (HOSPITAL_COMMUNITY): Payer: Self-pay | Admitting: *Deleted

## 2020-07-07 ENCOUNTER — Telehealth (HOSPITAL_COMMUNITY): Payer: Self-pay | Admitting: *Deleted

## 2020-07-07 DIAGNOSIS — Z01812 Encounter for preprocedural laboratory examination: Secondary | ICD-10-CM | POA: Insufficient documentation

## 2020-07-07 DIAGNOSIS — Z20822 Contact with and (suspected) exposure to covid-19: Secondary | ICD-10-CM | POA: Insufficient documentation

## 2020-07-07 LAB — SARS CORONAVIRUS 2 (TAT 6-24 HRS): SARS Coronavirus 2: NEGATIVE

## 2020-07-07 NOTE — Telephone Encounter (Signed)
Preadmission screen  

## 2020-07-08 ENCOUNTER — Inpatient Hospital Stay (HOSPITAL_COMMUNITY): Payer: No Typology Code available for payment source

## 2020-07-08 ENCOUNTER — Inpatient Hospital Stay (HOSPITAL_COMMUNITY)
Admission: AD | Admit: 2020-07-08 | Discharge: 2020-07-10 | DRG: 807 | Disposition: A | Discharge status: Home / Self Care | Payer: No Typology Code available for payment source | Attending: Family Medicine | Admitting: Family Medicine

## 2020-07-08 ENCOUNTER — Encounter (HOSPITAL_COMMUNITY): Payer: Self-pay | Admitting: Obstetrics & Gynecology

## 2020-07-08 ENCOUNTER — Other Ambulatory Visit: Payer: Self-pay

## 2020-07-08 DIAGNOSIS — Z20822 Contact with and (suspected) exposure to covid-19: Secondary | ICD-10-CM | POA: Diagnosis present

## 2020-07-08 DIAGNOSIS — Z3490 Encounter for supervision of normal pregnancy, unspecified, unspecified trimester: Secondary | ICD-10-CM

## 2020-07-08 DIAGNOSIS — Z3480 Encounter for supervision of other normal pregnancy, unspecified trimester: Secondary | ICD-10-CM

## 2020-07-08 DIAGNOSIS — O480 Post-term pregnancy: Secondary | ICD-10-CM | POA: Diagnosis present

## 2020-07-08 DIAGNOSIS — Z3A40 40 weeks gestation of pregnancy: Secondary | ICD-10-CM

## 2020-07-08 DIAGNOSIS — K429 Umbilical hernia without obstruction or gangrene: Secondary | ICD-10-CM | POA: Insufficient documentation

## 2020-07-08 LAB — CBC
HCT: 34.7 % — ABNORMAL LOW (ref 36.0–46.0)
Hemoglobin: 11.6 g/dL — ABNORMAL LOW (ref 12.0–15.0)
MCH: 31.7 pg (ref 26.0–34.0)
MCHC: 33.4 g/dL (ref 30.0–36.0)
MCV: 94.8 fL (ref 80.0–100.0)
Platelets: 196 10*3/uL (ref 150–400)
RBC: 3.66 MIL/uL — ABNORMAL LOW (ref 3.87–5.11)
RDW: 12.5 % (ref 11.5–15.5)
WBC: 8.7 10*3/uL (ref 4.0–10.5)
nRBC: 0 % (ref 0.0–0.2)

## 2020-07-08 LAB — TYPE AND SCREEN
ABO/RH(D): A POS
Antibody Screen: NEGATIVE

## 2020-07-08 MED ORDER — LACTATED RINGERS IV SOLN: INTRAVENOUS | Status: DC

## 2020-07-08 MED ORDER — DOCUSATE SODIUM 100 MG PO CAPS
100.0000 mg | ORAL_CAPSULE | Freq: Two times a day (BID) | ORAL | Status: DC
  Administered 2020-07-08 – 2020-07-09 (×3): 100 mg via ORAL
  Filled 2020-07-08 (×3): qty 1

## 2020-07-08 MED ORDER — ONDANSETRON HCL 4 MG PO TABS: 4.0000 mg | ORAL_TABLET | ORAL | Status: DC | PRN

## 2020-07-08 MED ORDER — LIDOCAINE HCL (PF) 1 % IJ SOLN
30.0000 mL | INTRAMUSCULAR | Status: AC | PRN
  Administered 2020-07-08: 30 mL via SUBCUTANEOUS
  Filled 2020-07-08: qty 30

## 2020-07-08 MED ORDER — FENTANYL CITRATE (PF) 100 MCG/2ML IJ SOLN
50.0000 ug | Freq: Once | INTRAMUSCULAR | Status: AC
  Administered 2020-07-08: 50 ug via INTRAVENOUS
  Filled 2020-07-08: qty 2

## 2020-07-08 MED ORDER — ACETAMINOPHEN 325 MG PO TABS: 650.0000 mg | ORAL_TABLET | ORAL | Status: DC | PRN

## 2020-07-08 MED ORDER — FLEET ENEMA 7-19 GM/118ML RE ENEM: 1.0000 | ENEMA | RECTAL | Status: DC | PRN

## 2020-07-08 MED ORDER — COCONUT OIL OIL: 1.0000 "application " | TOPICAL_OIL | Status: DC | PRN

## 2020-07-08 MED ORDER — ONDANSETRON HCL 4 MG/2ML IJ SOLN: 4.0000 mg | Freq: Four times a day (QID) | INTRAMUSCULAR | Status: DC | PRN

## 2020-07-08 MED ORDER — WITCH HAZEL-GLYCERIN EX PADS: 1.0000 "application " | MEDICATED_PAD | CUTANEOUS | Status: DC | PRN

## 2020-07-08 MED ORDER — SENNOSIDES-DOCUSATE SODIUM 8.6-50 MG PO TABS
2.0000 | ORAL_TABLET | ORAL | Status: DC
  Administered 2020-07-09 (×2): 2 via ORAL
  Filled 2020-07-08 (×2): qty 2

## 2020-07-08 MED ORDER — OXYTOCIN BOLUS FROM INFUSION
333.0000 mL | Freq: Once | INTRAVENOUS | Status: AC
  Administered 2020-07-08: 333 mL via INTRAVENOUS

## 2020-07-08 MED ORDER — OXYCODONE-ACETAMINOPHEN 5-325 MG PO TABS
1.0000 | ORAL_TABLET | ORAL | Status: DC | PRN
  Administered 2020-07-08: 1 via ORAL
  Filled 2020-07-08: qty 1

## 2020-07-08 MED ORDER — LACTATED RINGERS IV SOLN: 500.0000 mL | INTRAVENOUS | Status: DC | PRN

## 2020-07-08 MED ORDER — IBUPROFEN 600 MG PO TABS
600.0000 mg | ORAL_TABLET | Freq: Four times a day (QID) | ORAL | Status: DC
  Administered 2020-07-08 – 2020-07-10 (×8): 600 mg via ORAL
  Filled 2020-07-08 (×8): qty 1

## 2020-07-08 MED ORDER — SIMETHICONE 80 MG PO CHEW: 80.0000 mg | CHEWABLE_TABLET | ORAL | Status: DC | PRN

## 2020-07-08 MED ORDER — SOD CITRATE-CITRIC ACID 500-334 MG/5ML PO SOLN: 30.0000 mL | ORAL | Status: DC | PRN

## 2020-07-08 MED ORDER — DIBUCAINE (PERIANAL) 1 % EX OINT: 1.0000 "application " | TOPICAL_OINTMENT | CUTANEOUS | Status: DC | PRN

## 2020-07-08 MED ORDER — DIPHENHYDRAMINE HCL 25 MG PO CAPS: 25.0000 mg | ORAL_CAPSULE | Freq: Four times a day (QID) | ORAL | Status: DC | PRN

## 2020-07-08 MED ORDER — BENZOCAINE-MENTHOL 20-0.5 % EX AERO
1.0000 "application " | INHALATION_SPRAY | CUTANEOUS | Status: DC | PRN
  Administered 2020-07-08: 1 via TOPICAL
  Filled 2020-07-08: qty 56

## 2020-07-08 MED ORDER — ONDANSETRON HCL 4 MG/2ML IJ SOLN: 4.0000 mg | INTRAMUSCULAR | Status: DC | PRN

## 2020-07-08 MED ORDER — OXYCODONE-ACETAMINOPHEN 5-325 MG PO TABS: 2.0000 | ORAL_TABLET | ORAL | Status: DC | PRN

## 2020-07-08 MED ORDER — PRENATAL MULTIVITAMIN CH
1.0000 | ORAL_TABLET | Freq: Every day | ORAL | Status: DC
  Administered 2020-07-09: 1 via ORAL
  Filled 2020-07-08: qty 1

## 2020-07-08 MED ORDER — OXYTOCIN-SODIUM CHLORIDE 30-0.9 UT/500ML-% IV SOLN
2.5000 [IU]/h | INTRAVENOUS | Status: DC
  Filled 2020-07-08: qty 500

## 2020-07-08 NOTE — H&P (Signed)
.. OBSTETRIC ADMISSION HISTORY AND PHYSICAL  Dana Eaton is a 34 y.o. female G2P1001 with IUP at [redacted]w[redacted]d by 48 WK [place] presenting for IOL for post dates.. She reports +FMs, No LOF, no VB, no blurry vision, no headaches, mild peripheral edema, and no RUQ pain.    She plans on breastfeeding. She plans on withdrawal method for birth control. She received her prenatal care at [facility]   Dating: By 11 wk [place]  --->  Estimated Date of Delivery: 07/04/20  Sono: @ 19 wks, CWD, normal anatomy,  Reactive NST on 8.4.21  Prenatal History/Complications:  Past Medical History: Past Medical History:  Diagnosis Date  . Anemia   . History of intestinal parasite     Past Surgical History: Past Surgical History:  Procedure Laterality Date  . MANDIBLE SURGERY      Obstetrical History: OB History    Gravida  2   Para  1   Term  1   Preterm      AB      Living  1     SAB      TAB      Ectopic      Multiple  0   Live Births  1           Social History: Social History   Socioeconomic History  . Marital status: Married    Spouse name: Not on file  . Number of children: Not on file  . Years of education: Not on file  . Highest education level: Not on file  Occupational History  . Not on file  Tobacco Use  . Smoking status: Never Smoker  . Smokeless tobacco: Never Used  . Tobacco comment: tried it once before-smoking   Vaping Use  . Vaping Use: Never used  Substance and Sexual Activity  . Alcohol use: Not Currently    Alcohol/week: 0.0 standard drinks  . Drug use: No  . Sexual activity: Yes    Partners: Male    Birth control/protection: None  Other Topics Concern  . Not on file  Social History Narrative  . Not on file   Social Determinants of Health   Financial Resource Strain:   . Difficulty of Paying Living Expenses:   Food Insecurity:   . Worried About Programme researcher, broadcasting/film/video in the Last Year:   . Barista in the Last Year:   Transportation Needs:   .  Freight forwarder (Medical):   Marland Kitchen Lack of Transportation (Non-Medical):   Physical Activity:   . Days of Exercise per Week:   . Minutes of Exercise per Session:   Stress:   . Feeling of Stress :   Social Connections:   . Frequency of Communication with Friends and Family:   . Frequency of Social Gatherings with Friends and Family:   . Attends Religious Services:   . Active Member of Clubs or Organizations:   . Attends Banker Meetings:   Marland Kitchen Marital Status:     Family History: Family History  Problem Relation Age of Onset  . Osteoarthritis Maternal Grandmother   . Osteoporosis Maternal Grandmother   . Pancreatic cancer Maternal Grandmother 55  . Hypertension Maternal Grandmother   . Seizures Father   . Seizures Sister   . Colon cancer Paternal Grandmother   . Hypertension Paternal Grandmother   . Hypertension Maternal Grandfather   . Hypertension Paternal Grandfather   . Esophageal cancer Neg Hx   . Rectal cancer Neg  Hx   . Stomach cancer Neg Hx     Allergies: No Known Allergies  Medications Prior to Admission  Medication Sig Dispense Refill Last Dose  . cephALEXin (KEFLEX) 500 MG capsule Take 1 capsule (500 mg total) by mouth 3 (three) times daily. 21 capsule 0   . docusate sodium (COLACE) 100 MG capsule Take 1 capsule (100 mg total) by mouth 2 (two) times daily. 30 capsule 3   . Lactobacillus (CULTURELLE DIGESTIVE WOMENS) CAPS Take 1 capsule by mouth daily.     . Misc. Devices (BREAST PUMP) MISC 1 Units by Does not apply route as needed. Disp 1 electric breast pump for assistance with breast feeding.  EDC 07/04/20 1 each 0   . Prenatal Vit-Fe Fumarate-FA (PRENATAL MULTIVITAMIN) TABS tablet Take 1 tablet by mouth daily at 12 noon.        Review of Systems   All systems reviewed and negative except as stated in HPI  Blood pressure 119/75, pulse 79, temperature 98.8 F (37.1 C), temperature source Oral, height 5\' 4"  (1.626 m), weight 73.3 kg, last  menstrual period 10/05/2019. General appearance: alert, cooperative, appears stated age and no distress Lungs: clear to auscultation bilaterally Heart: regular rate and rhythm Abdomen: soft, non-tender; bowel sounds normal Extremities: Homans sign is negative, no sign of DVT Presentation: cephalic Fetal monitoringBaseline: 140 bpm, Variability: Good {> 6 bpm) and Accelerations: Reactive Uterine activity: irregular Dilation: 5 Effacement (%): 90 Station: -1 Exam by:: [email or web address], CNM AROM, small amount of clear fluid   Prenatal labs: ABO, Rh: A/RH(D) POSITIVE/-- (01/11 0925) Antibody: NO ANTIBODIES DETECTED (01/11 0925) Rubella: 4.36 (01/11 0925) RPR: NON-REACTIVE (04/19 0845)  HBsAg: NON-REACTIVE (01/11 0925)  HIV: NON-REACTIVE (04/19 0845)  GBS: Negative/-- (07/09 0000)  2 hr GTT 74, 89. 91 Genetic screening  Neg Anatomy 06-26-2002 normal    Nursing Staff Provider  Office Location [place]  Dating  Early U/S--11 weeks (LMP was off by one weeks)  Language  English Anatomy US  normal  Flu Vaccine  Did not recieve Genetic Screen  NIPS: NML BOY   AFP:  Nml   TDaP vaccine   Apr 25, 2020 Hgb A1C or  GTT  Third trimester:  Normal   Rhogam  N/A   LAB RESULTS   Feeding Plan breast Blood Type A/RH(D) POSITIVE/-- (01/11 0925)   Contraception Coitus interuptus Antibody NO ANTIBODIES DETECTED (01/11 0925)  Circumcision Yes Rubella 4.36 (01/11 0925)  Pediatrician  [facility] RPR NON-REACTIVE (01/11 0925)   Support Person 05-15-1989 HBsAg NON-REACTIVE (01/11 0925)   Prenatal Classes Took before HIV NON-REACTIVE (01/11 0925)  BTL Consent Declined GBS  (neg  VBAC Consent N/A Pap 1/20 NML    Hgb Electro  neg  BP Cuff Yes, on BRx. CF Neg    SMA Neg    Waterbirth  [ ]  Class [ ]  Consent [ ]  CNM visit       Prenatal Transfer Tool  Maternal Diabetes: No Genetic Screening: Normal Maternal Ultrasounds/Referrals: Normal Fetal Ultrasounds or other Referrals:  None Maternal Substance Abuse:   No Significant Maternal Medications:  None Significant Maternal Lab Results: None  Results for orders placed or performed during the hospital encounter of 07/08/20 (from the past 24 hour(s))  CBC   Collection Time: 07/08/20 11:26 AM  Result Value Ref Range   WBC 8.7 4.0 - 10.5 K/uL   RBC 3.66 (L) 3.87 - 5.11 MIL/uL   Hemoglobin 11.6 (L) 12.0 - 15.0 g/dL   HCT (  L) 36 - 46 %   MCV 94.8 80.0 - 100.0 fL   MCH 31.7 26.0 - 34.0 pg   MCHC 33.4 30.0 - 36.0 g/dL   RDW 21.2 24.8 - 25.0 %   Platelets 196 150 - 400 K/uL   nRBC 0.0 0.0 - 0.2 %    Patient Active Problem List   Diagnosis Date Noted  . Term pregnancy 07/08/2020  . Umbilical hernia 07/08/2020  . Supervision of other normal pregnancy, antepartum 12/14/2019  . Migraine without aura and without status migrainosus, not intractable 07/28/2019  . Diastasis recti 05/02/2018  . Family history of osteoporosis 08/17/2014    Assessment/Plan:  Dana Eaton is a 34 y.o. G2P1001 at [redacted]w[redacted]d here for IOL for postdates   #Labor: AROM, anticipate VD #Pain: unmedicated currently, rates as 3 #FWB: Cat 1 tracing will continue to monitor  #ID: none #MOF:breast  #MOC:withdrawl  #Circ: yes   Bryson Dames, Student-MidWife  07/08/2020, 12:55 PM

## 2020-07-08 NOTE — Lactation Note (Addendum)
This note was copied from a baby's chart. Lactation Consultation Note  Patient Name: Dana Eaton WSFKC'L Date: 07/08/2020 Reason for consult: Initial assessment;NICU baby;Other (Comment) (LGA infant) P2, 6 hour term LGA infant in NICU. Infant had one void and had large stool while LC in room ( meconium) black. [place] asked RN where was Doctor's feeding order to BF infant.  Per RN Marisue Humble) infant is to transition back to mom's room after observed observation.  Per RN, she assisted mom with latching infant at breast and infant BF for 20 minutes at 2005 pm infant BF well, LC did not observe latch ,RN help mom with breastfeeding infant. Mom is still in NICU, [facility] set mom up with DEBP in NICU ( infant's room) and explained how to use. LC did ask RN about infant breathing concern with latching infant at breast,  due to past hx of respiratory distress, grunting and retractions.  Per  RN, infant is BF well at breast and she observed his breathing. LC explained to mom  how to use hand pump in DEBP kit,  when demonstrating from hand pump to DEBP  mom easily expressed 7 ms of colostrum that was placed in fridge. Mom's plan with RN assistance is to latch infant first and then offer supplemental EBM to infant by spoon or slow flow bottle nipple. Infant is currently being given a pacifier, per RN this is their NICU policy.  LC explained to parents  NICU feeding policy with breast milk , storage and collection.  Mom understands to pump every 3 hours on initial setting for 15 minutes to help establish milk supply due infant currently being in NICU. RN discussed with mom, she will alert mom when infant is cuing and mom plans to go back to NICU through out the night and latch infant at breast until infant transition's to [facility] care with mom. LC discussed with mom if she is tired, she can pump and have EBM taken to NICU for infant until infant is released from NICU back into her room. Mom will do as much  STS with infant according to NICU guidelines. Mom has DEBP both  in NICU and in [facility] speciality care. Mom knows to call [facility] services if she has questions or concerns.  Mom made aware of O/P services, breastfeeding support groups, community resources, and our phone # for post-discharge questions.      Maternal Data    Feeding    LATCH Score                   Interventions Interventions: Breast feeding basics reviewed;Skin to skin;Breast massage;Hand express;Hand pump;DEBP;Expressed milk  Lactation Tools Discussed/Used Pump Review: Setup, frequency, and cleaning;Milk Storage Initiated by:: Vicente Serene, IBCLC Date initiated:: 07/08/20   Consult Status Consult Status: Follow-up Date: 07/09/20 Follow-up type: In-patient    Vicente Serene 07/08/2020, 9:57 PM

## 2020-07-08 NOTE — Lactation Note (Addendum)
This note was copied from a baby's chart. Lactation Consultation Note  Patient Name: Dana Eaton Today's Date: 07/08/2020  P2, 4 hour LGA infant with respiratory distress ( grunting, retractions and cyanosis ) in NICU see Pediatrician note. LC talked with 3rd floor RN  On Vocera , mom  was to return to 1st floor OB, LC went to patient's room mom not present. LC call RN who is infant's NICU Nurse and per RN, mom decided she wants stay with infant for 40 or more minutes before she plans to return to her room. Mom has DEBP set up  in her room ( 103) but has not used it at this time. RN will review with mom how to use DEBP and importance of pumping every 3 hours for 15 minutes on initial setting to help establish mom's milk supply.   LC will follow up with mom on 3rd floor in ( room 306)     Maternal Data    Feeding    LATCH Score                   Interventions    Lactation Tools Discussed/Used     Consult Status      Danelle Earthly 07/08/2020, 9:01 PM

## 2020-07-08 NOTE — Discharge Summary (Addendum)
Postpartum Discharge Summary  Date of Service updated 07/10/2020     Patient Name: Dana Eaton DOB: [birth year] MRN: [id number]  Date of admission: 07/08/2020 Delivery date:07/08/2020  Delivering provider: Tonna Corner  Date of discharge: 07/10/2020 Admitting diagnosis: Term pregnancy [Z34.90] Intrauterine pregnancy: [redacted]w[redacted]d    Secondary diagnosis:  Active Problems:   Term pregnancy  Additional problems: none    Discharge diagnosis: Term Pregnancy Delivered                                              Post partum procedures:none Augmentation: AROM Complications: None  Hospital course: Induction of Labor With Vaginal Delivery   34y.o. yo G2P1001 at 48w4das admitted to the hospital 07/08/2020 for induction of labor.  Indication for induction: Postdates and Favorable cervix at term.  Patient had an uncomplicated labor course as follows: Membrane Rupture Time/Date: 12:44 PM ,07/08/2020   Delivery Method:Vaginal, Spontaneous  Episiotomy: None  Lacerations:  2nd degree;Perineal  Details of delivery can be found in separate delivery note.  Shoulder dystocia was identified and baby initially was in the NICU. Patient had a routine postpartum course. Patient is discharged home   07/10/2020  Newborn Data: Birth date:07/08/2020  Birth time:3:27 PM  Gender:Female  Living status:Living  Apgars:7 ,9  Weight:4490 g   Magnesium Sulfate received: No BMZ received: No Rhophylac:N/A MMR:N/A T-DaP:Given prenatally Flu: Yes Transfusion:No  Physical exam  Vitals:   07/09/20 1601 07/09/20 1915 07/09/20 2302 07/10/20 0408  BP: 101/61 112/73 101/61 95/61  Pulse: 71 78 80 68  Resp: 18 18 18 18   Temp: 98.2 F (36.8 C) 98.4 F (36.9 C) 98.4 F (36.9 C) 97.8 F (36.6 C)  TempSrc: Oral Oral Oral Oral  SpO2: 99% 99% 99% 98%  Weight:      Height:       General: alert, cooperative and no distress Lochia: appropriate Uterine Fundus: firm Incision: N/A DVT Evaluation: No evidence  of DVT seen on physical exam. Labs: Lab Results  Component Value Date   WBC 8.7 07/08/2020   HGB 11.6 (L) 07/08/2020   HCT 34.7 (L) 07/08/2020   MCV 94.8 07/08/2020   PLT 196 07/08/2020   CMP Latest Ref Rng & Units 07/10/2019  Glucose 65 - 99 mg/dL 84  BUN 7 - 25 mg/dL 16  Creatinine 0.50 - 1.10 mg/dL 0.68  Sodium 135 - 146 mmol/L 137  Potassium 3.5 - 5.3 mmol/L 4.2  Chloride 98 - 110 mmol/L 103  CO2 20 - 32 mmol/L 28  Calcium 8.6 - 10.2 mg/dL 9.2  Total Protein 6.1 - 8.1 g/dL 6.9  Total Bilirubin 0.2 - 1.2 mg/dL 0.5  Alkaline Phos 33 - 115 U/L -  AST 10 - 30 U/L 13  ALT 6 - 29 U/L 14   Edinburgh Score: Edinburgh Postnatal Depression Scale Screening Tool 07/08/2020  I have been able to laugh and see the funny side of things. (No Data)     After visit meds:  Allergies as of 07/10/2020   No Known Allergies     Medication List    STOP taking these medications   Breast Pump Misc   cephALEXin 500 MG capsule Commonly known as: KEFLEX   Culturelle Digestive Womens Caps   docusate sodium 100 MG capsule Commonly known as: Colace     TAKE these medications   ibuprofen  600 MG tablet Commonly known as: ADVIL Take 1 tablet (600 mg total) by mouth every 6 (six) hours.   prenatal multivitamin Tabs tablet Take 1 tablet by mouth daily at 12 noon.        Discharge home in stable condition Infant Feeding: Breast Infant Disposition:home with mother Discharge instruction: per After Visit Summary and Postpartum booklet. Activity: Advance as tolerated. Pelvic rest for 6 weeks.  Diet: routine diet Future Appointments:No future appointments. Follow up Visit:  [place] for [place] at [facility] Follow up in 4 week(s).   Specialty: Obstetrics and Gynecology Contact information: [place], [place] [phone]               Please schedule this patient for a Virtual postpartum visit in 4  weeks with the following provider: Any provider. Additional Postpartum F/U:none Low risk pregnancy   Delivery mode:  Vaginal, Spontaneous  Anticipated Birth Control:  Unsure   07/10/2020 Emeterio Reeve, MD

## 2020-07-09 LAB — RPR: RPR Ser Ql: NONREACTIVE

## 2020-07-09 MED ORDER — IBUPROFEN 600 MG PO TABS: 600.0000 mg | ORAL_TABLET | Freq: Four times a day (QID) | ORAL | 0 refills | Status: DC

## 2020-07-09 NOTE — Lactation Note (Signed)
This note was copied from a baby's chart. Lactation Consultation Note  Patient Name: Dana Eaton RDEYC'X Date: 07/09/2020 Reason for consult: Follow-up assessment;Term  LC in to visit with P2 Mom and FOB of term baby that transitioned back from NICU for TTN after shoulder dystocia at delivery.  Baby 18 hrs old and at .7% weight loss  Mom had been set up with a DEBP and instructed on pumping.  Baby in Mom's arms this am, loosely swaddled in blanket.  Mom states baby latched and fed for 15 mins.  Talked about the benefits of baby being STS to encourage baby to breastfeed.  Baby sleepy per parents.    Offered to assist with placing baby STS on Mom's chest.  Baby's diaper changed which was wet and baby voided a large amount when changing diaper.    Baby became a little fussy, placed prone on Mom's chest.  Reclined Mom back in recliner and aided baby in finding the breast.  Mom had hand expressed a drop of colostrum onto her nipple and baby opened widely and latched with ease.  Baby was 9 lbs 14.4 oz at birth and Mom is a small framed woman.  Pillow support provided and Mom comfortable with baby in semi-reclined position.  Mom has small breasts, erect nipples and compressible areola.  Colostrum easily expressed.  Reassured Mom and encouraged her to focus on baby latching often to the breast, rather than using the pump.    Mom has a Spectra DEBP at home.  Feeding Feeding Type: Breast Fed  LATCH Score Latch: Grasps breast easily, tongue down, lips flanged, rhythmical sucking.  Audible Swallowing: Spontaneous and intermittent  Type of Nipple: Everted at rest and after stimulation  Comfort (Breast/Nipple): Soft / non-tender  Hold (Positioning): Assistance needed to correctly position infant at breast and maintain latch.  LATCH Score: 9  Interventions Interventions: Breast feeding basics reviewed;Assisted with latch;Skin to skin;Breast massage;Hand express;Breast compression;Adjust  position;Support pillows;Position options;DEBP  Lactation Tools Discussed/Used Tools: Pump Breast pump type: Double-Electric Breast Pump   Consult Status Consult Status: Follow-up Date: 07/10/20 Follow-up type: In-patient    Judee Clara 07/09/2020, 9:40 AM

## 2020-07-09 NOTE — Discharge Instructions (Signed)

## 2020-07-09 NOTE — Plan of Care (Signed)
Postpartum assessment is within normal limits. Plan of care discussed and understood. Infant transferred from NICU around 0100. Patient is pumping with some success.

## 2020-07-10 NOTE — Plan of Care (Signed)
Patient recovery continues to progress. Breastfeeding is improving.

## 2020-07-10 NOTE — Lactation Note (Addendum)
This note was copied from a baby's chart. Lactation Consultation Note  Patient Name: Dana Eaton [id number] Date: 07/10/2020 Reason for consult: Follow-up assessment;Term;Nipple pain/trauma   Infant is 45 hours old 40 weeks. Infant transitioned back from NICU for TTN. Infant current weight loss at 5.68% Mom is a P2 with previous experience with breastfeeding. Infant at the breast in cradle on the left side, Mom expressed pain with the latch. LC demonstrated how to break the latch, slight compression of the nipple noted. LC assisted Mom into a deeper latch with breast compression and cross cradle. Mom states the latch was comfortable and she was able to latch the baby on her own on the right side with no discomfort. Audible swallows observed throughout the feeding.   Mom has a Spectra DEBP at home. Education provided to the Mom on feeding 8-12x 24 hour period, engorgement, LC services and outpatient support. Mom has the brochure with guidelines on how to clean pump parts and milk storage.   Recommended to Mom to also verify with Peds practice if they have LC in the practice.     Interventions Interventions: Breast feeding basics reviewed;Assisted with latch;Breast compression;Adjust position;Support pillows;Position options;Expressed milk  Lactation Tools Discussed/Used     Consult Status Consult Status: Complete Date: 07/10/20    Alizea Pell  Nicholson-Springer 07/10/2020, 1:18 PM

## 2020-07-17 ENCOUNTER — Inpatient Hospital Stay (HOSPITAL_COMMUNITY)
Admission: AD | Admit: 2020-07-17 | Discharge: 2020-07-17 | Disposition: A | Discharge status: Home / Self Care | Payer: No Typology Code available for payment source | Attending: Obstetrics and Gynecology | Admitting: Obstetrics and Gynecology

## 2020-07-17 ENCOUNTER — Other Ambulatory Visit: Payer: Self-pay

## 2020-07-17 DIAGNOSIS — O864 Pyrexia of unknown origin following delivery: Secondary | ICD-10-CM | POA: Insufficient documentation

## 2020-07-17 DIAGNOSIS — O9229 Other disorders of breast associated with pregnancy and the puerperium: Secondary | ICD-10-CM | POA: Diagnosis not present

## 2020-07-17 DIAGNOSIS — O9279 Other disorders of lactation: Secondary | ICD-10-CM | POA: Diagnosis not present

## 2020-07-17 DIAGNOSIS — Z79899 Other long term (current) drug therapy: Secondary | ICD-10-CM | POA: Insufficient documentation

## 2020-07-17 DIAGNOSIS — Z791 Long term (current) use of non-steroidal anti-inflammatories (NSAID): Secondary | ICD-10-CM | POA: Diagnosis not present

## 2020-07-17 DIAGNOSIS — N6459 Other signs and symptoms in breast: Secondary | ICD-10-CM

## 2020-07-17 LAB — URINALYSIS, ROUTINE W REFLEX MICROSCOPIC
Bilirubin Urine: NEGATIVE
Glucose, UA: NEGATIVE mg/dL
Ketones, ur: NEGATIVE mg/dL
Nitrite: NEGATIVE
Protein, ur: NEGATIVE mg/dL
Specific Gravity, Urine: 1.009 (ref 1.005–1.030)
pH: 6 (ref 5.0–8.0)

## 2020-07-17 NOTE — MAU Note (Signed)
Dana Eaton is a 35 y.o. at [facility] delivered Aug 6th  here in MAU reporting: fever up to 103.9 (took ibuprofen), abdominal pain, headache  Onset of complaint: last night Pain score:9 Vitals:   07/17/20 2113  BP: 118/67  Pulse: (!) 107  Resp: 18  Temp: (!) 100.4 F (38 C)  SpO2: 98%

## 2020-07-17 NOTE — MAU Provider Note (Signed)
History     CSN: [id number]  Arrival date and time: 07/17/20 2105   First Provider Initiated Contact with Patient 07/17/20 2222      Chief Complaint  Patient presents with  . Fever  . Headache   HPI Dana Eaton is a 34 y.o. [id number] postpartum for a vaginal delivery on 8/6 who presents with fever. She states she had chills tonight and took her temperature and it was 103. She reports she took it after wearing a hoodie and blanket and sitting outside so she feels like it may have been falsly high. She states her breasts are tender and she is having a hard time with breast feeding. She states baby is not latching well so she is pumping more than breast feeding. She reports she usually pumps at bedtime and then not again until 6am. She denies any abdominal pain. Reports she is still having some bleeding but no odor. She took ibuprofen around 1800.   OB History    Gravida  2   Para  2   Term  2   Preterm      AB      Living  2     SAB      TAB      Ectopic      Multiple  0   Live Births  2           Past Medical History:  Diagnosis Date  . Anemia   . History of intestinal parasite     Past Surgical History:  Procedure Laterality Date  . MANDIBLE SURGERY      Family History  Problem Relation Age of Onset  . Osteoarthritis Maternal Grandmother   . Osteoporosis Maternal Grandmother   . Pancreatic cancer Maternal Grandmother 15  . Hypertension Maternal Grandmother   . Seizures Father   . Seizures Sister   . Colon cancer Paternal Grandmother   . Hypertension Paternal Grandmother   . Hypertension Maternal Grandfather   . Hypertension Paternal Grandfather   . Esophageal cancer Neg Hx   . Rectal cancer Neg Hx   . Stomach cancer Neg Hx     Social History   Tobacco Use  . Smoking status: Never Smoker  . Smokeless tobacco: Never Used  . Tobacco comment: tried it once before-smoking   Vaping Use  . Vaping Use: Never used  Substance Use Topics  .  Alcohol use: Not Currently    Alcohol/week: 0.0 standard drinks  . Drug use: No    Allergies: No Known Allergies  Medications Prior to Admission  Medication Sig Dispense Refill Last Dose  . docusate sodium (COLACE) 100 MG capsule Take 100 mg by mouth 2 (two) times daily.   07/17/2020 at Unknown time  . ibuprofen (ADVIL) 600 MG tablet Take 1 tablet (600 mg total) by mouth every 6 (six) hours. 30 tablet 0 07/17/2020 at 1850  . Prenatal Vit-Fe Fumarate-FA (PRENATAL MULTIVITAMIN) TABS tablet Take 1 tablet by mouth daily at 12 noon.   07/17/2020 at 1200    Review of Systems  Constitutional: Positive for fever. Negative for fatigue.  HENT: Negative.   Respiratory: Negative.  Negative for shortness of breath.   Cardiovascular: Negative.  Negative for chest pain.  Gastrointestinal: Negative.  Negative for abdominal pain, constipation, diarrhea, nausea and vomiting.  Genitourinary: Positive for vaginal bleeding. Negative for dysuria and vaginal discharge.  Neurological: Negative.  Negative for dizziness and headaches.   Physical Exam   Blood pressure 104/68, pulse Marland Kitchen)  101, temperature 100 F (37.8 C), temperature source Oral, resp. rate 17, height 5\' 4"  (1.626 m), weight 61.2 kg, last menstrual period 10/05/2019, SpO2 98 %, unknown if currently breastfeeding.  Physical Exam Vitals and nursing note reviewed.  Constitutional:      General: She is not in acute distress.    Appearance: She is well-developed.  HENT:     Head: Normocephalic.  Eyes:     Pupils: Pupils are equal, round, and reactive to light.  Cardiovascular:     Rate and Rhythm: Normal rate and regular rhythm.     Heart sounds: Normal heart sounds.  Pulmonary:     Effort: Pulmonary effort is normal. No respiratory distress.     Breath sounds: Normal breath sounds.  Chest:     Breasts:        Right: Tenderness present.        Left: Tenderness present.     Comments: Engorgement bilaterally. No redness or streaking. Both  nipples cracked Abdominal:     General: Bowel sounds are normal. There is no distension.     Palpations: Abdomen is soft.     Tenderness: There is no abdominal tenderness.  Skin:    General: Skin is warm and dry.  Neurological:     Mental Status: She is alert and oriented to person, place, and time.  Psychiatric:        Behavior: Behavior normal.        Thought Content: Thought content normal.        Judgment: Judgment normal.     MAU Course  Procedures  MDM Assisted with flange sizes and pumping in MAU. Discussed with patient importance of pumping every 2-3 hours, even over night, if she is going to exclusively pump.   Does not appear to be mastitis at this time. Discussed seeing lactation this week to help with baby's latch and assess for tongue/lip ties.  Patient very tearful and reports feeling very anxious and worrying a lot. Denies SI, HI. Will seen in person in [place] this week to assess breasts and mood.   Assessment and Plan   1. Breast engorgement   2. Postpartum state    -Discharge home in stable condition -Mastitis precautions discussed -Patient advised to follow-up with [facility] this week, message sent to schedule appointment -Patient may return to MAU as needed or if her condition were to change or worsen   13/01/2019 CNM 07/17/2020, 10:42 PM

## 2020-07-17 NOTE — Discharge Instructions (Signed)
Mastitis  Mastitis is irritation and swelling (inflammation) in an area of the breast. It is often caused by an infection that occurs when germs (bacteria) enter the skin. This most often happens to breastfeeding mothers, but it can happen to other women too as well as some men. Follow these instructions at home: Medicines  Take over-the-counter and prescription medicines only as told by your doctor.  If you were prescribed an antibiotic medicine, take it as told by your doctor. Do not stop taking it even if you start to feel better. General instructions  Do not wear a tight or underwire bra. Wear a soft support bra.  Drink more fluids, especially if you have a fever.  Get plenty of rest. If you are breastfeeding:   Keep emptying your breasts by breastfeeding or by using a breast pump.  Keep your nipples clean and dry.  During breastfeeding, empty the first breast before going to the other breast. Use a breast pump if your baby is not emptying your breasts.  Massage your breasts during feeding or pumping as told by your doctor.  If told, put moist heat on the affected area of your breast right before breastfeeding or pumping. Use the heat source that your doctor tells you to use.  If told, put ice on the affected area of your breast right after breastfeeding or pumping: ? Put ice in a plastic bag. ? Place a towel between your skin and the bag. ? Leave the ice on for 20 minutes.  If you go back to work, pump your breasts while at work.  Avoid letting your breasts get overly filled with milk (engorged). Contact a doctor if:  You have pus-like fluid leaking from your breast.  You have a fever.  Your symptoms do not get better within 2 days. Get help right away if:  Your pain and swelling are getting worse.  Your pain is not helped by medicine.  You have a red line going from your breast toward your armpit. Summary  Mastitis is irritation and swelling in an area of  the breast.  If you were prescribed an antibiotic medicine, do not stop taking it even if you start to feel better.  Drink more fluids and get plenty of rest.  Contact a doctor if your symptoms do not get better within 2 days. This information is not intended to replace advice given to you by your health care provider. Make sure you discuss any questions you have with your health care provider. Document Revised: 11/01/2017 Document Reviewed: 12/11/2016 Elsevier Patient Education  2020 Elsevier Inc.  

## 2020-07-17 NOTE — MAU Note (Signed)
Pt states she took ibuprofen at 6:50 pm.

## 2020-07-22 ENCOUNTER — Ambulatory Visit: Payer: No Typology Code available for payment source | Admitting: Obstetrics and Gynecology

## 2020-07-27 ENCOUNTER — Telehealth: Payer: Self-pay | Admitting: *Deleted

## 2020-07-27 NOTE — Telephone Encounter (Signed)
Pt called concerned that she passed a golf ball size clot last night.  She states that she didn't really have any cramping with it but was afraid it was retained placenta.   She is 2 weeks post delivery.  I explained that it was not uncommon to still have an occasional clot even 2 weeks post delivery.  I explained that if the bleeding becomes heavy like changing a Kotex every 30 minutes or if she starts to have severe uterine cramping then suggest she be seen.  Pt agrees and voices understanding.

## 2020-08-01 ENCOUNTER — Other Ambulatory Visit: Payer: Self-pay

## 2020-08-01 ENCOUNTER — Encounter: Payer: Self-pay | Admitting: Obstetrics and Gynecology

## 2020-08-01 ENCOUNTER — Ambulatory Visit (INDEPENDENT_AMBULATORY_CARE_PROVIDER_SITE_OTHER): Payer: No Typology Code available for payment source | Admitting: Obstetrics and Gynecology

## 2020-08-01 DIAGNOSIS — N939 Abnormal uterine and vaginal bleeding, unspecified: Secondary | ICD-10-CM

## 2020-08-01 DIAGNOSIS — R5081 Fever presenting with conditions classified elsewhere: Secondary | ICD-10-CM

## 2020-08-01 DIAGNOSIS — Z7189 Other specified counseling: Secondary | ICD-10-CM

## 2020-08-01 DIAGNOSIS — N719 Inflammatory disease of uterus, unspecified: Secondary | ICD-10-CM

## 2020-08-01 DIAGNOSIS — N6459 Other signs and symptoms in breast: Secondary | ICD-10-CM

## 2020-08-01 MED ORDER — SULFAMETHOXAZOLE-TRIMETHOPRIM 800-160 MG PO TABS
1.0000 | ORAL_TABLET | Freq: Two times a day (BID) | ORAL | 1 refills | Status: DC
Start: 2020-08-01 — End: 2020-08-18

## 2020-08-01 NOTE — Progress Notes (Signed)
Obstetrics/Postpartum Visit  Appointment Date: 08/01/2020  [facility]  Primary Care Provider: Sunnie Nielsen  Chief Complaint:  Chief Complaint  Patient presents with  . Postpartum Care    History of Present Illness: Dana Eaton is a 34 y.o. Caucasian G2P2002 (Patient's last menstrual period was 10/05/2019.), seen for the above chief complaint. Her past medical history is significant for anxiety.  She is s/p SVD with 45 second shoulder dystocia n 07/08/20 at 40 weeks; she was discharged to home on PPD#2. Pregnancy complicated by n/a.  Complains of still having bleeding. Sometimes dark, sometimes brighter. Bleeding stopped for a day after delivery and then started again. Also complains of feeling chills every few days, wakes up at night in sweat. Has taken her temp at home and it is 100.3 and has been as high as 101 at home. Was 103 at hospital. Was told at hospital that she may be starting to have mastitis, it was felt to be breast engorgement at that time. She is pumping every 2 hours and feels she is not emptying breasts well.   Also had a large blood clot come out and is concerned that she may have retained placenta. Also concerned about numbness on outside left lower extremity. Has felt this since soon after delivery. Also concerned about ulcers in mouth, possible sinus congestion, states her son has had same symptoms and he and husband tested negative for COVID.   Vaginal bleeding or discharge: Yes  Breast or formula feeding: breast Intercourse: No  Contraception: rhythm PP depression s/s: No  Any bowel or bladder issues: No  Pap smear: no abnormalities (date: 12/2018)  Review of Systems: Positive for n/a.   Her 12 point review of systems is negative or as noted in the History of Present Illness.  Patient Active Problem List   Diagnosis Date Noted  . Term pregnancy 07/08/2020  . Umbilical hernia 07/08/2020  . Supervision of other normal pregnancy,  antepartum 12/14/2019  . Migraine without aura and without status migrainosus, not intractable 07/28/2019  . Diastasis recti 05/02/2018  . Family history of osteoporosis 08/17/2014    Medications Pershing Proud had no medications administered during this visit. Current Outpatient Medications  Medication Sig Dispense Refill  . cephALEXin (KEFLEX) 500 MG capsule Take 500 mg by mouth 4 (four) times daily.    Marland Kitchen docusate sodium (COLACE) 100 MG capsule Take 100 mg by mouth 2 (two) times daily.    Marland Kitchen ibuprofen (ADVIL) 600 MG tablet Take 1 tablet (600 mg total) by mouth every 6 (six) hours. 30 tablet 0  . Prenatal Vit-Fe Fumarate-FA (PRENATAL MULTIVITAMIN) TABS tablet Take 1 tablet by mouth daily at 12 noon.    . sulfamethoxazole-trimethoprim (BACTRIM DS) 800-160 MG tablet Take 1 tablet by mouth 2 (two) times daily. 14 tablet 1   No current facility-administered medications for this visit.    Allergies Patient has no known allergies.  Physical Exam:  BP 110/63   Pulse 74   Temp 98.9 F (37.2 C)   Resp 16   Ht 5\' 4"  (1.626 m)   Wt 131 lb (59.4 kg)   LMP 10/05/2019   Breastfeeding Yes   BMI 22.49 kg/m  Body mass index is 22.49 kg/m. General appearance: Well nourished, well developed female in no acute distress.  Cardiovascular: regular rate and rhythm Respiratory:  Normal respiratory effort Abdomen: non distended Breasts: breasts appear normal, no suspicious masses, no skin or nipple changes or axillary nodes.diffusely warm to touch Neuro/Psych:  Normal mood and affect.  Skin:  Warm and dry.   Pelvic exam: is not limited by body habitus, uterus diffusely moderately tender EGBUS: within normal limits Vagina: within normal limits and with sutures present, one long suture removed  PP Depression Screening:    Edinburgh Postnatal Depression Scale - 08/01/20 1308      Edinburgh Postnatal Depression Scale:  In the Past 7 Days   I have been able to laugh and see the funny side of  things. 0    I have looked forward with enjoyment to things. 0    I have blamed myself unnecessarily when things went wrong. 1    I have been anxious or worried for no good reason. 1    I have felt scared or panicky for no good reason. 1    Things have been getting on top of me. 0    I have been so unhappy that I have had difficulty sleeping. 0    I have felt sad or miserable. 1    I have been so unhappy that I have been crying. 1    The thought of harming myself has occurred to me. 0    Edinburgh Postnatal Depression Scale Total 5           Assessment: Patient is a 34 y.o. [id number] who is 4 weeks post partum from a spontaneous vaginal delivery. She is doing okay, c/o fever, chills, breast pain/swelling. See PCP for sinus/congestion.  Plan:  1. Postpartum state Normal exam, 2nd degree healing well  2. Counseled about COVID-19 virus infection The patient was counseled on the potential benefits and lack of known risks of COVID vaccination, during pregnancy and breastfeeding, on today's visit. The patient's questions and concerns were addressed today, including unsure. The patient is not planning to get vaccinated at this time.  3. Abnormal uterine bleeding (AUB) Could be due to endometritis, less likely retained products of conceptioin  4. Fever in other diseases  5. Breast engorgement Gave info regarding improving engorgement Possible fever/chills due to engorgment  6. Endometritis Concern for endometritis based on diffuse uterine tenderness [place] to rule out retained products of conception   Essential components of care per ACOG recommendations:  1.  Mood and well being: Patient with negative depression screening today. Reviewed local resources for support.  - Patient does not use tobacco.  - hx of drug use? No   2. Infant care and feeding:  -Patient currently breastmilk feeding? Yes  Reviewed importance of draining breast regularly to support lactation. -Social determinants  of health (SDOH) reviewed in EPIC. No concerns  3. Sexuality, contraception and birth spacing - Patient does not know want a pregnancy in the next year.  Desired family size is unknown children.  - Reviewed forms of contraception in tiered fashion. Patient desired natural family planning (NFP) today.    4. Sleep and fatigue -Encouraged family/partner/community support of 4 hrs of uninterrupted sleep to help with mood and fatigue  5. Physical Recovery  - Discussed patients delivery and complications - Patient had a 2nd degree laceration, perineal healing reviewed. Patient expressed understanding - Patient has urinary incontinence? No - Patient is safe to resume physical and sexual activity  6.  Health Maintenance - Last pap smear done 12/2018 and was normal with negative HPV.   RTC prn   Baldemar Lenis, M.D. Attending [facility])

## 2020-08-01 NOTE — Patient Instructions (Signed)
Breast Engorgement Breast engorgement is the overfilling of your breasts with breast milk. It is usually caused by delaying feedings, which can cause milk to build up. Breast engorgement can happen at any time while you are breast feeding, and is normal in the first 3-5 days after giving birth. The condition can make your breasts feel heavy, full, hard, tightly stretched, warm, and tender. Breast engorgement should improve within 24-48 hours of feeding your baby or expressing your milk. Follow these instructions at home: When to breastfeed or pump  Breastfeed when your baby shows signs of hunger. This is called "breastfeeding on demand."  Breastfeed or use a breast pump to remove milk from your breasts when you feel the need to reduce the fullness of your breasts.  If your baby is younger than 1 month, make sure you are breastfeeding every 1-3 hours during the day. You may need to wake up your baby to feed if he or she is asleep at a feeding time.  Do not allow your baby to sleep longer than 5 hours during the night without a feeding.  Do not delay feedings.  If you are returning to work or are away from home for an extended period, try to pump your milk on the same schedule as when your baby would breastfeed. Before breastfeeding or pumping:  Increase the circulation in your breasts and help your milk flow. Try either of these methods: ? Taking a warm shower. ? Applying warm, water-soaked hand towels to your breasts. ? Massaging your breasts.  Pump or hand-express breast milk before breastfeeding to soften your breast, areola, and nipple. During breastfeeding or pumping:  Try to relax when it is time to feed your baby. This helps to trigger your "let-down reflex," which releases milk from your breast.  Ensure your baby is latched on to your breast and positioned properly while breastfeeding.  Empty your breasts completely when breastfeeding or pumping.  Allow your baby to remain at  your breast as long as he or she is latched on well and sucking. Your baby will let you know when he or she is done breastfeeding by pulling away from your breast or falling asleep.  Massage your breasts to help your milk flow. Managing pain and swelling   Take over-the-counter and prescription medicines only as told by your health care provider.  If directed, put ice on your breasts: ? Put ice in a plastic bag. ? Place a towel between your skin and the bag. ? Leave the ice on for 20 minutes, 2-3 times a day.  If you feel pain while breastfeeding, take your baby off your breast and try again. General instructions  After breastfeeding or pumping wear a snug bra or tank top for 1-2 days. This will signal your body to slightly decrease how much milk it makes. Once the engorgement passes, make sure you to wear a well-fitted, supportive bra and regular clothes.  Drink enough fluid to keep your urine clear or pale yellow.  Avoid introducing bottles or pacifiers to your baby in the early weeks of breastfeeding. Wait to introduce these things until after resolving any breastfeeding challenges. Contact a health care provider if:  Engorgement lasts longer than 2 days, even after treatment.  You have flu-like symptoms, such as a fever, chills, or body aches.  You have nausea or you vomit.  Your breasts become red and painful.  You have a lump in your breast.  Your nipples continue to crack or start to  ooze.  There is yellow discharge coming from a nipple.  You have pain while breastfeeding, and it does not go away once you take your baby off your breast and try again. Get help right away if:  There is pus or blood in your breast milk.  You have sudden, severe symptoms.  You have red streaks near your breast.  Both breasts appear infected and you cannot breastfeed. Summary  Breast engorgement is the overfilling of your breasts with breast milk. It is usually caused by delayed  feeding.  Although it is normal to experience breast engorgement 3-5 days after giving birth, it can happen at any time while breastfeeding.  Do not delay feedings. Breastfeed on demand to help prevent engorgement.  Increase the circulation in your breasts and help your milk flow before feeding your baby. You can do this by taking a warm shower, applying warm water-soaked hand towels, or massaging your breasts. This information is not intended to replace advice given to you by your health care provider. Make sure you discuss any questions you have with your health care provider. Document Revised: 11/01/2017 Document Reviewed: 12/24/2016 Elsevier Patient Education  2020 [employer].

## 2020-08-02 ENCOUNTER — Ambulatory Visit (INDEPENDENT_AMBULATORY_CARE_PROVIDER_SITE_OTHER): Payer: No Typology Code available for payment source

## 2020-08-02 DIAGNOSIS — N852 Hypertrophy of uterus: Secondary | ICD-10-CM

## 2020-08-02 DIAGNOSIS — R935 Abnormal findings on diagnostic imaging of other abdominal regions, including retroperitoneum: Secondary | ICD-10-CM

## 2020-08-02 DIAGNOSIS — R5081 Fever presenting with conditions classified elsewhere: Secondary | ICD-10-CM | POA: Diagnosis not present

## 2020-08-02 DIAGNOSIS — N719 Inflammatory disease of uterus, unspecified: Secondary | ICD-10-CM | POA: Diagnosis not present

## 2020-08-05 ENCOUNTER — Telehealth: Payer: No Typology Code available for payment source | Admitting: Family Medicine

## 2020-08-18 ENCOUNTER — Encounter: Payer: Self-pay | Admitting: Obstetrics and Gynecology

## 2020-08-18 ENCOUNTER — Telehealth (INDEPENDENT_AMBULATORY_CARE_PROVIDER_SITE_OTHER): Payer: No Typology Code available for payment source | Admitting: Obstetrics and Gynecology

## 2020-08-18 DIAGNOSIS — O9279 Other disorders of lactation: Secondary | ICD-10-CM

## 2020-08-18 DIAGNOSIS — N719 Inflammatory disease of uterus, unspecified: Secondary | ICD-10-CM

## 2020-08-18 NOTE — Progress Notes (Signed)
GYNECOLOGY VIRTUAL VISIT ENCOUNTER NOTE  Provider location: [facility] [place]   I connected with Pershing Proud on 08/18/20 at  2:45 PM EDT by MyChart Video Encounter at home and verified that I am speaking with the correct person using two identifiers.   I discussed the limitations, risks, security and privacy concerns of performing an evaluation and management service virtually and the availability of in person appointments. I also discussed with the patient that there may be a patient responsible charge related to this service. The patient expressed understanding and agreed to proceed.   History:  Chantrell Apsey is a 34 y.o. [phone] female being evaluated today for follow up for endometritis. Took antibiotics and was feeling better, then today reports a clogged duct from breast feeding and a fever. Up until today though, she was feeling better. Bleeding is minimal, not passing any large clots anymore. Reports family had RSV but is now doing better. She is using a massage device for her clogged duct.     Past Medical History:  Diagnosis Date  . Anemia   . History of intestinal parasite    Past Surgical History:  Procedure Laterality Date  . MANDIBLE SURGERY     The following portions of the patient's history were reviewed and updated as appropriate: allergies, current medications, past family history, past medical history, past social history, past surgical history and problem list.    Review of Systems:  Pertinent items noted in HPI and remainder of comprehensive ROS otherwise negative.  Physical Exam:   General:  Alert, oriented and cooperative. Patient appears to be in no acute distress.  Mental Status: Normal mood and affect. Normal behavior. Normal judgment and thought content.   Respiratory: Normal respiratory effort, no problems with respiration noted  Rest of physical exam deferred due to type of encounter  Labs and Imaging No results  found for this or any previous visit (from the past 336 hour(s)). US PELVIC COMPLETE WITH TRANSVAGINAL  Result Date: 08/02/2020 CLINICAL DATA:  Endometritis, question retained products of conception, delivery 3 weeks ago, currently lactating, bleeding and passing clots since delivery, fever EXAM: TRANSABDOMINAL AND TRANSVAGINAL ULTRASOUND OF PELVIS TECHNIQUE: Both transabdominal and transvaginal ultrasound examinations of the pelvis were performed. Transabdominal technique was performed for global imaging of the pelvis including uterus, ovaries, adnexal regions, and pelvic cul-de-sac. It was necessary to proceed with endovaginal exam following the transabdominal exam to visualize the endometrium and adnexa. COMPARISON:  None FINDINGS: Uterus Measurements: 8.9 x 5.0 x 7.5 cm = volume: 171 mL. Mildly enlarged postpartum uterus. No focal mass Endometrium Thickness: 16 mm. Mildly heterogeneous. Few tiny foci of fluid at the lower uterine segment endometrial canal. No focal mass. Right ovary Measurements: 3.3 x 1.5 x 1.9 cm = volume: 5 mL. Normal morphology without mass Left ovary Measurements: 2.8 x 1.8 x 2.0 cm = volume: 5 mL. Seen only on transabdominal imaging. Obscured by bowel on transvaginal imaging. No focal mass. Other findings Small amount of nonspecific free pelvic fluid.  No adnexal masses. IMPRESSION: Mildly enlarged postpartum uterus without focal mass Nonspecific free pelvic fluid. Mildly thickened and heterogeneous endometrial complex 16 mm thick, containing several tiny foci of fluid at the lower uterine segment. No obvious retained products of conception or endometrial mass identified. Electronically Signed   By: Ulyses Southward M.D.   On: 08/02/2020 18:04       Assessment and Plan:     1. Endometritis Doing better No retained products  on [place]  2. Clogged duct, postpartum Encouraged use of warm compress, expressing milk/feeding and massage  Call if no improvement or fever worsens, may need to be  seen for possible mastitis       I discussed the assessment and treatment plan with the patient. The patient was provided an opportunity to ask questions and all were answered. The patient agreed with the plan and demonstrated an understanding of the instructions.   The patient was advised to call back or seek an in-person evaluation/go to the ED if the symptoms worsen or if the condition fails to improve as anticipated.  I provided 15 minutes of face-to-face time during this encounter.   Conan Bowens, MD [facility], [facility]

## 2020-08-20 ENCOUNTER — Other Ambulatory Visit: Payer: Self-pay | Admitting: Obstetrics and Gynecology

## 2020-08-20 ENCOUNTER — Encounter: Payer: Self-pay | Admitting: Obstetrics and Gynecology

## 2020-08-20 DIAGNOSIS — O9123 Nonpurulent mastitis associated with lactation: Secondary | ICD-10-CM | POA: Insufficient documentation

## 2020-08-20 MED ORDER — IBUPROFEN 600 MG PO TABS: ORAL_TABLET | ORAL | 0 refills | Status: DC

## 2020-08-20 MED ORDER — DICLOXACILLIN SODIUM 500 MG PO CAPS
500.0000 mg | ORAL_CAPSULE | Freq: Four times a day (QID) | ORAL | 0 refills | Status: AC
Start: 2020-08-20 — End: 2020-08-25

## 2020-08-20 NOTE — Progress Notes (Signed)
   No e/o abscess on phone. Recommend: diclox, q6h motrin, q2h cool compresses, q2h pumping or feeding toempty breast and to draw on breast to outline the redness. If worsens come to hospital  [place] Bing, Montez Hageman MD Attending Center for [facility] (Faculty Practice) 08/20/2020 Time: 1005am

## 2020-08-25 ENCOUNTER — Ambulatory Visit (INDEPENDENT_AMBULATORY_CARE_PROVIDER_SITE_OTHER): Payer: No Typology Code available for payment source | Admitting: Obstetrics & Gynecology

## 2020-08-25 ENCOUNTER — Other Ambulatory Visit: Payer: Self-pay

## 2020-08-25 ENCOUNTER — Encounter: Payer: Self-pay | Admitting: Obstetrics & Gynecology

## 2020-08-25 VITALS — BP 102/63 | HR 64 | Temp 98.5°F | Resp 16 | Ht 64.0 in | Wt 126.0 lb

## 2020-08-25 DIAGNOSIS — O9123 Nonpurulent mastitis associated with lactation: Secondary | ICD-10-CM | POA: Diagnosis not present

## 2020-08-25 DIAGNOSIS — N6313 Unspecified lump in the right breast, lower outer quadrant: Secondary | ICD-10-CM | POA: Diagnosis not present

## 2020-08-25 NOTE — Progress Notes (Addendum)
   GYNECOLOGY OFFICE VISIT NOTE  History:   Dana Eaton is a 34 y.o. [id number] here today for continued evaluation of right breast pain and lump.  Has been treated for clogged duct and mastitis over the last month, but reports not feeling any better.  She feels she has an abscess. Has a painful right breast mass. Pain not alleviated by nursing, warm compresses, Motrin and herbal/OTC remedies.  She denies any abnormal vaginal discharge, bleeding, pelvic pain or other concerns.    Past Medical History:  Diagnosis Date  . Anemia   . History of intestinal parasite     Past Surgical History:  Procedure Laterality Date  . MANDIBLE SURGERY      The following portions of the patient's history were reviewed and updated as appropriate: allergies, current medications, past family history, past medical history, past social history, past surgical history and problem list.   Health Maintenance:  Normal pap and negative HRHPV on  12/11/2018  Review of Systems:  Pertinent items noted in HPI and remainder of comprehensive ROS otherwise negative.  Physical Exam:  BP 102/63   Pulse 64   Temp 98.5 F (36.9 C)   Resp 16   Ht 5\' 4"  (1.626 m)   Wt 126 lb (57.2 kg)   LMP 10/05/2019   Breastfeeding Yes   BMI 21.63 kg/m  CONSTITUTIONAL: Well-developed, well-nourished female in no acute distress.  HENT:  Normocephalic, atraumatic, External right and left ear normal. Oropharynx is clear and moist SKIN: Skin is warm and dry. No rash noted. Not diaphoretic. No erythema. No pallor. MUSCULOSKELETAL: Normal range of motion. No tenderness.  No cyanosis, clubbing, or edema. NEUROLOGIC: Alert and oriented to person, place, and time. Normal reflexes, muscle tone coordination.  PSYCHIATRIC: Normal mood and affect. Normal behavior. Normal judgment and thought content. CARDIOVASCULAR: Normal heart rate noted, regular rhythm RESPIRATORY:  Effort and breath sounds normal, no problems with respiration  noted. ABDOMEN: Soft, no distention noted.  No overt masses.  PELVIC: Deferred BREASTS: Symmetric in size. 4-5cm tender area around 6-8 o'clock position, no fluid collection able to palpated. Warm to touch, no erythema. No abnormal nipple drainage.  No other masses, tenderness, skin changes, nipple drainage bilaterally. Done with a chaperone present.      Assessment and Plan:      1. Nonpurulent mastitis associated with lactation 2. Lump in lower outer quadrant of right breast Could be clogged ducts but will refer to Breast Center for evaluation for possible abscess and management.  Will also refer to Breast Clinic. Advised to continue NSAIDs for now and compresses/massages to area.   - 13/01/2019 BREAST LTD UNI RIGHT INC AXILLA; Future - MM DIAG BREAST TOMO BILATERAL; Future - Ambulatory referral to Breast Clinic  Return for any gynecologic concerns.    Total face-to-face time with patient: 15 minutes.  Over 50% of encounter was spent on counseling and coordination of care.   [place], MD, FACOG Obstetrician & Gynecologist, [facility] for [facility]

## 2020-08-25 NOTE — Patient Instructions (Signed)
Breastfeeding and Human Lactation (4th ed., pp. 262-299). [place], [place]: [employer].">  Breastfeeding and Mastitis  Mastitis is inflammation of the breast tissue. It can occur in women who are breastfeeding. This can make breastfeeding painful. Mastitis will sometimes go away on its own, especially if it is not caused by an infection (non-infectious mastitis). Your health care provider will help determine if medical treatment is needed. Treatment may be needed if the condition is caused by a bacterial infection (infectious mastitis). What are the causes? This condition is often associated with a blocked milkduct, which can happen when too much milk builds up in the breast. Causes of excess milk in the breast can include:  Poor latch-on. If your baby is not latched onto the breast properly, he or she may not empty your breast completely while breastfeeding.  Allowing too much time to pass between feedings.  Wearing a bra or other clothing that is too tight. This puts extra pressure on the milk ducts so milk does not flow through them as it should.  Milk remaining in the breast because it is overfilled (engorged).  Stress and fatigue. Mastitis can also be caused by a bacterial infection. Bacteria may enter the breast tissue through cuts, cracks, or openings in the skin near the nipple area. Cracks in the skin are often caused when your baby does not latch on properly to the breast. What are the signs or symptoms? Symptoms of this condition include:  Swelling, redness, tenderness, and pain in an area of the breast. This usually affects the upper part of the breast, toward the armpit region. In most cases, it affects only one breast. In some cases, it may occur on both breasts at the same time and affect a larger portion of breast tissue.  Swelling of the glands under the arm on the same side.  Fatigue, headache, and flu-like muscle aches.  Fever.  Rapid pulse. Symptoms  usually last 2 to 5 days. Breast pain and redness are at their worst on day 2 and day 3, and they usually go away by day 5. If an infection is left to progress, a collection of pus (abscess) may develop. How is this diagnosed? This condition can be diagnosed based on your symptoms and a physical exam. You may also have tests, such as:  Blood tests to determine if your body is fighting a bacterial infection.  Mammogram or ultrasound tests to rule out other problems or diseases.  Fluid tests. If an abscess has developed, the fluid in the abscess may be removed with a needle. The fluid may be analyzed to determine if bacteria are present.  Breast milk may be cultured and tested for bacteria. How is this treated? This condition will sometimes go away on its own. Your health care provider may choose to wait 24 hours after first seeing you to decide whether treatment is needed. If treatment is needed, it may include:  Strategies to manage breastfeeding. This includes continuing to breastfeed or pump in order to allow adequate milk flow, using breast massage, and applying heat or cold to the affected area.  Self-care such as rest and increased fluid intake.  Medicine for pain.  Antibiotic medicine to treat a bacterial infection. This is usually taken by mouth.  If an abscess has developed, it may be treated by removing fluid with a needle. Follow these instructions at home: Medicines  Take over-the-counter and prescription medicines only as told by your health care provider.  If you   were prescribed an antibiotic medicine, take it as told by your health care provider. Do not stop taking the antibiotic even if you start to feel better. General instructions  Do not wear a tight or underwire bra. Wear a soft, supportive bra.  Increase your fluid intake, especially if you have a fever.  Get plenty of rest. For breastfeeding:  Continue to empty your breasts as often as possible, either by  breastfeeding or using an electric breast pump. This will lower the pressure and the pain that comes with it. Ask your health care provider if changes need to be made to your breastfeeding or pumping routine.  Keep your nipples clean and dry.  During breastfeeding, empty the first breast completely before going to the other breast. If your baby is not emptying your breasts completely, use a breast pump to empty your breasts.  Use breast massage during feeding or pumping sessions.  If directed, apply moist heat to the affected area of your breast right before breastfeeding or pumping. Use the heat source that your health care provider recommends.  If directed, put ice on the affected area of your breast right after breastfeeding or pumping: ? Put ice in a plastic bag. ? Place a towel between your skin and the bag. ? Leave the ice on for 20 minutes.  If you go back to work, pump your breasts while at work to stay in time with your nursing schedule.  Do not allow your breasts to become engorged. Contact a health care provider if:  You have pus-like discharge from the breast.  You have a fever.  Your symptoms do not improve within 2 days of starting treatment.  Your symptoms return after you have recovered from a breast infection. Get help right away if:  Your pain and swelling are getting worse.  You have pain that is not controlled with medicine.  You have a red line extending from the breast toward your armpit. Summary  Mastitis is inflammation of the breast tissue. It is often caused by a blocked milk duct or bacteria.  This condition may be treated with hot and cold compresses, medicines, self-care, and certain breastfeeding strategies.  If you were prescribed an antibiotic medicine, take it as told by your health care provider. Do not stop taking the antibiotic even if you start to feel better.  Continue to empty your breasts as often as possible either by breastfeeding or  using an electric breast pump. This information is not intended to replace advice given to you by your health care provider. Make sure you discuss any questions you have with your health care provider. Document Revised: 08/08/2018 Document Reviewed: 11/20/2016 Elsevier Patient Education  2020 Elsevier Inc.  

## 2020-09-09 ENCOUNTER — Other Ambulatory Visit: Payer: No Typology Code available for payment source

## 2020-09-11 IMAGING — US ULTRASOUND ABDOMEN LIMITED
1 series · 14 of 25 positions shown · non-contrast
Comparison: None.

CLINICAL DATA: Abdominal pain with nausea

EXAM:
ULTRASOUND ABDOMEN LIMITED RIGHT UPPER QUADRANT

[Series 1: ultrasound abdomen limited · 0.14mm/px · 14 of 103 slices shown]
[im 1/103]
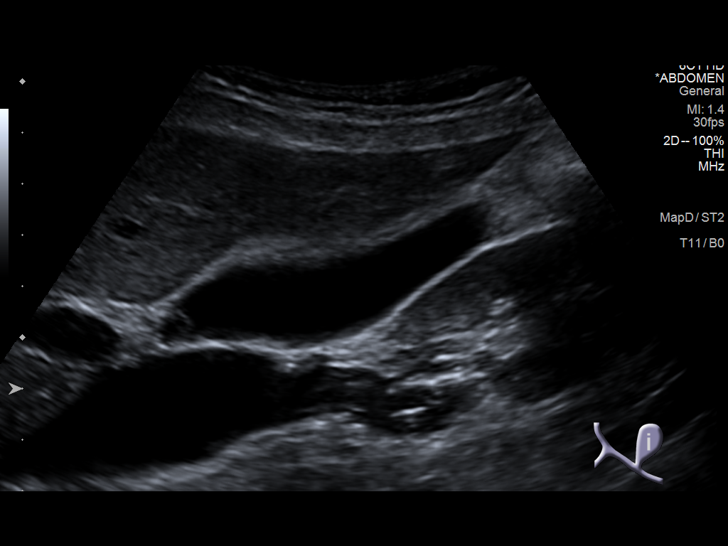
[im 9/103]
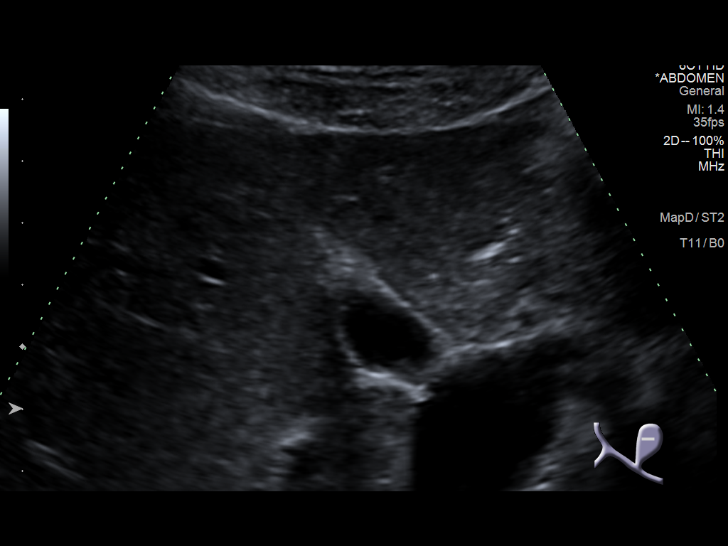
[im 18/103]
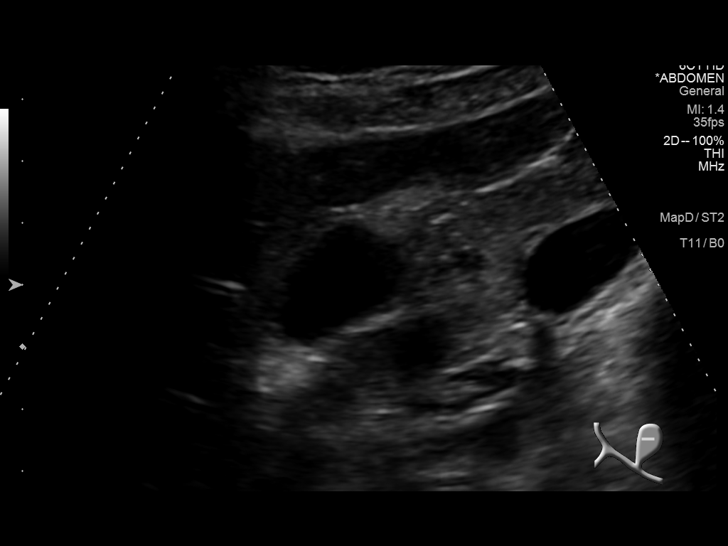
[im 26/103]
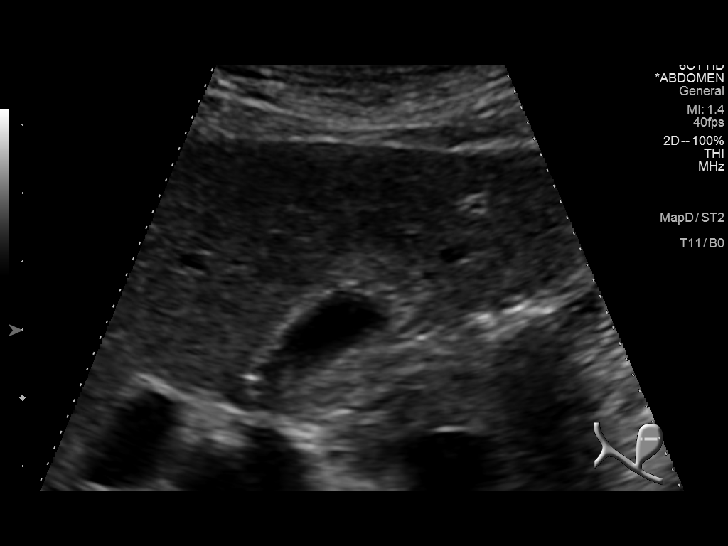
[im 35/103]
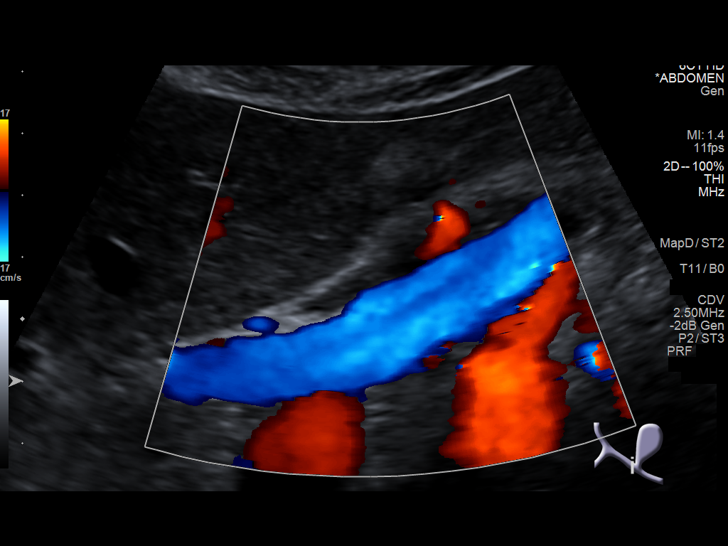
[im 39/103]
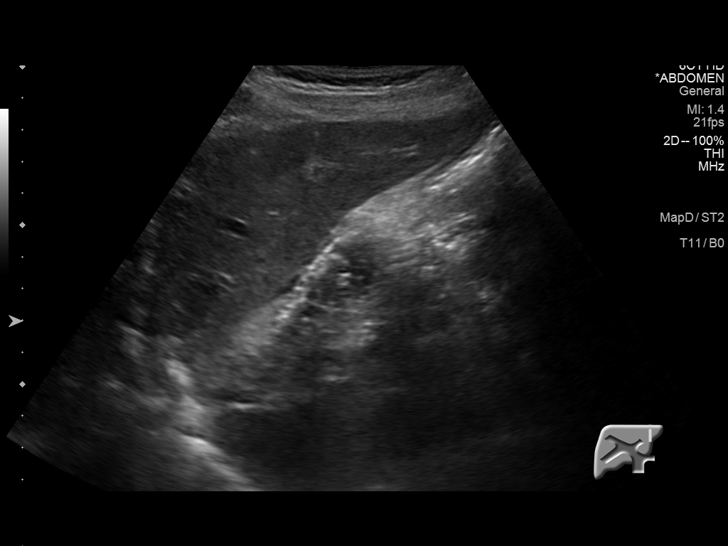
[im 47/103]
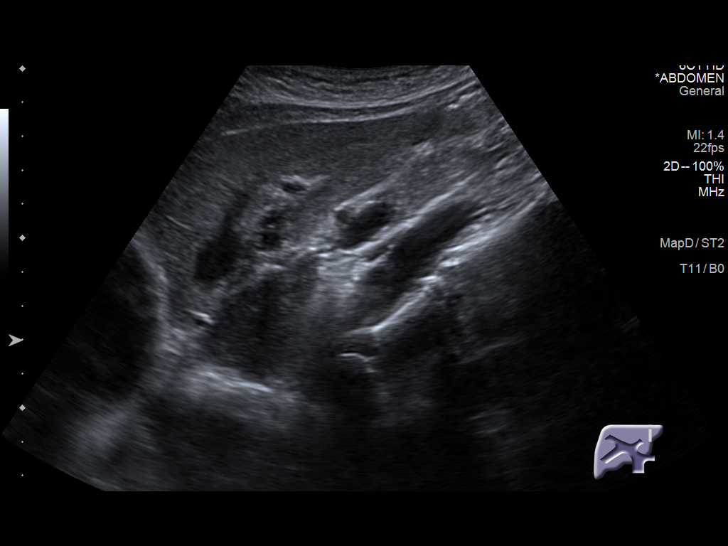
[im 56/103]
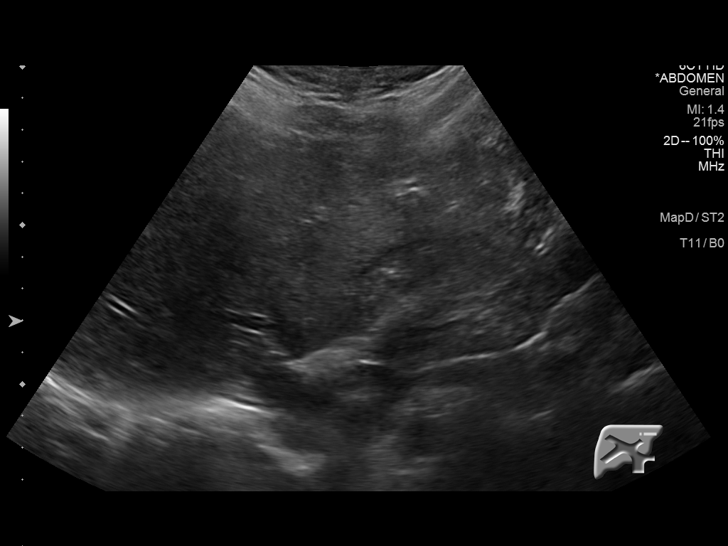
[im 64/103]
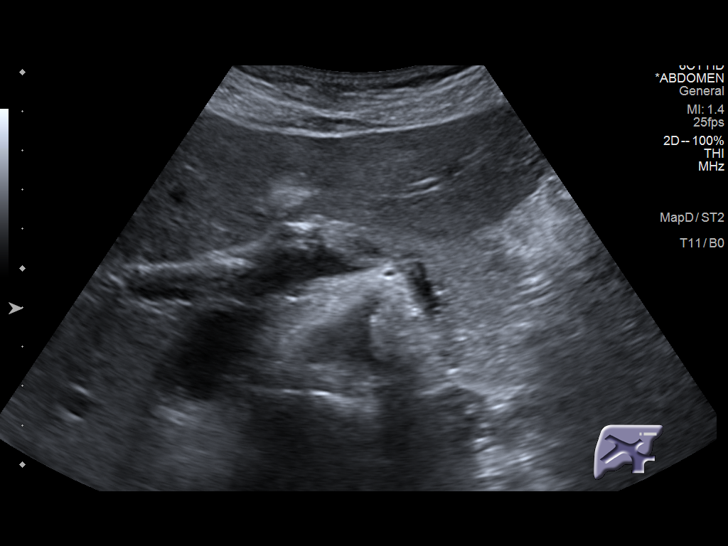
[im 69/103]
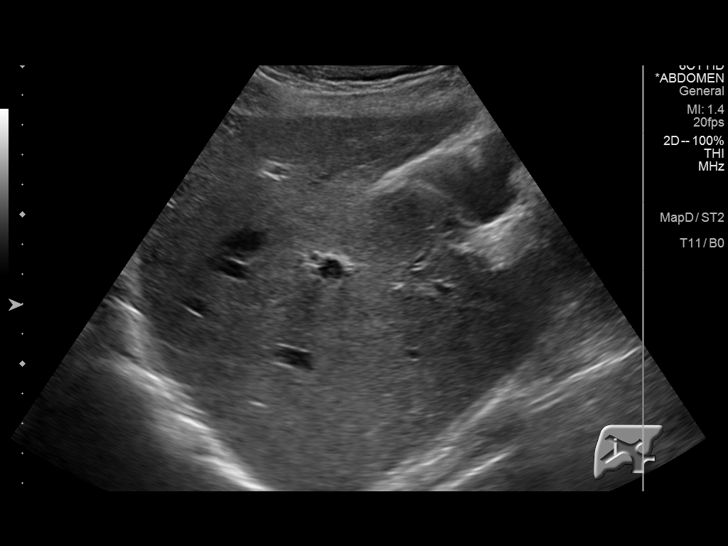
[im 77/103]
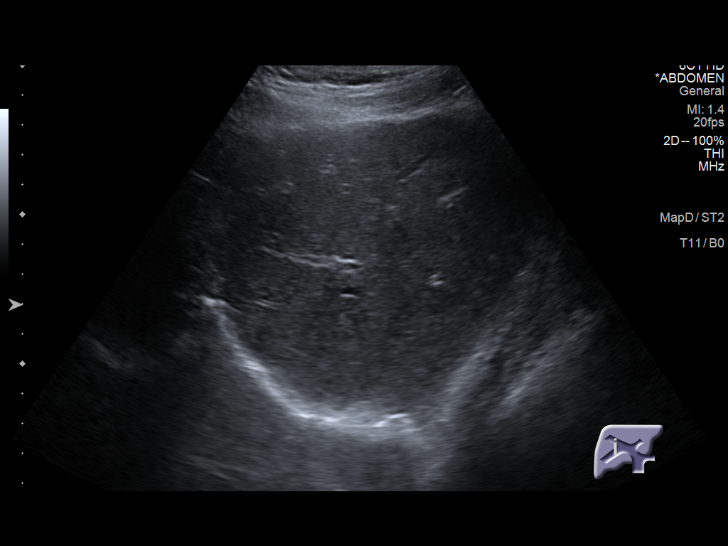
[im 86/103]
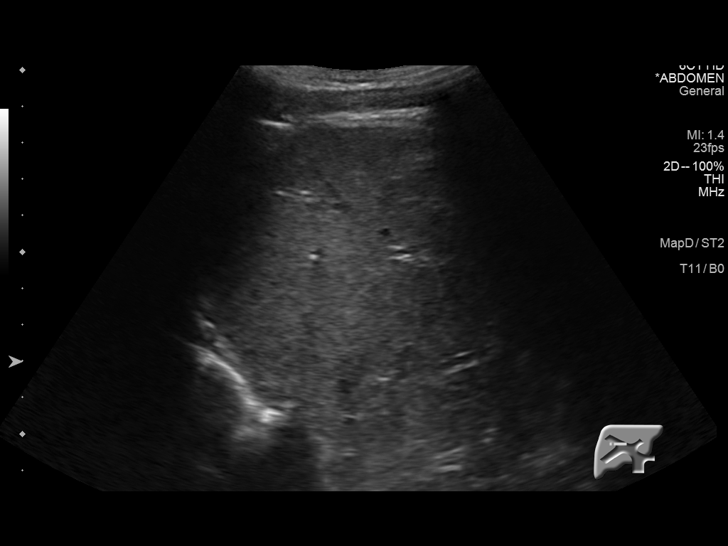
[im 94/103]
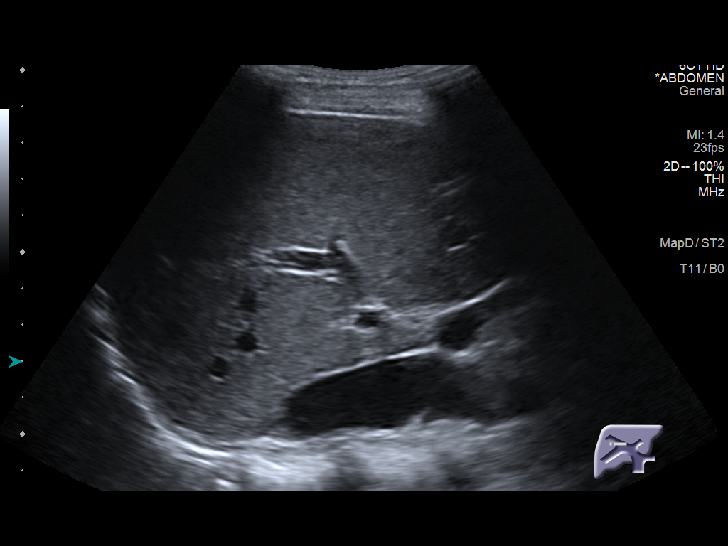
[im 103/103]
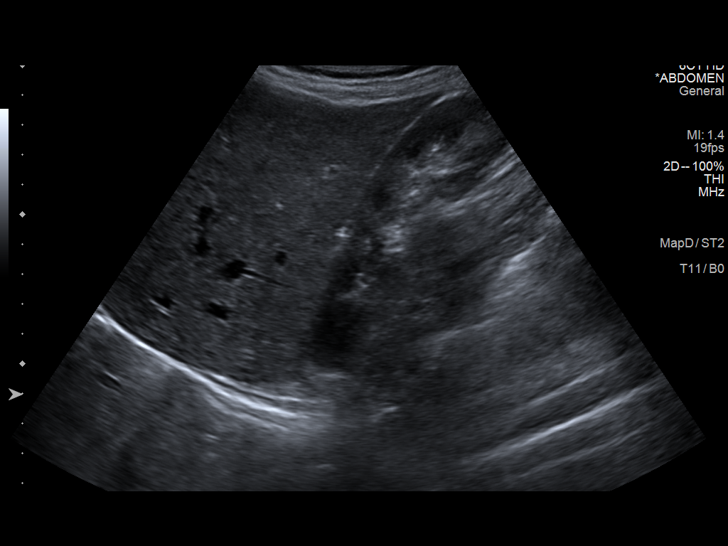

[14 of 25 positions shown; findings below may reference images not displayed]

FINDINGS: Gallbladder:

No gallstones or wall thickening visualized. There is no
pericholecystic fluid. No sonographic Murphy sign noted by
sonographer.

Common bile duct:

Diameter: 4 mm. No intrahepatic or extrahepatic biliary duct
dilatation.

Liver:

No focal lesion identified. Within normal limits in parenchymal
echogenicity. Portal vein is patent on color Doppler imaging with
normal direction of blood flow towards the liver.

Other: None.
IMPRESSION: Study within normal limits.

## 2020-09-28 ENCOUNTER — Telehealth: Payer: Self-pay | Admitting: *Deleted

## 2020-09-28 NOTE — Telephone Encounter (Signed)
Patient does not want to schedule 3 month F/U visit at this time, no issues. Will call if she needs to schedule.

## 2021-04-06 IMAGING — US US MFM OB COMP +14 WKS
1 series · 13 of 28 positions shown · non-contrast
Comparison: none

[Series 1: us mfm ob comp +14 wks · 13 of 120 slices shown]
[im 5/120]
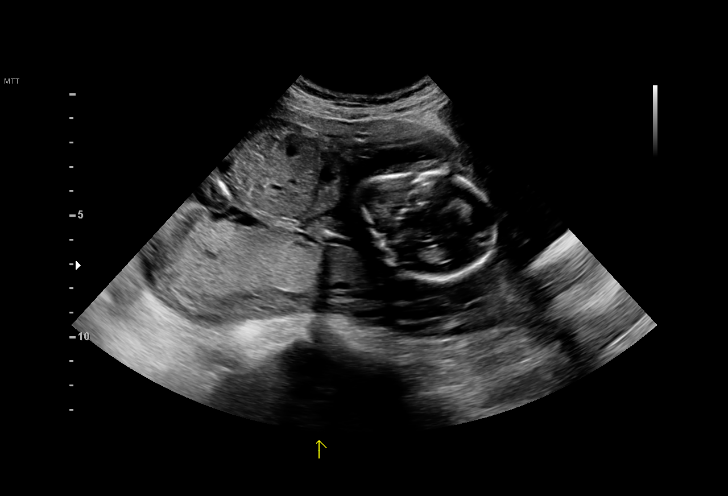
[im 14/120]
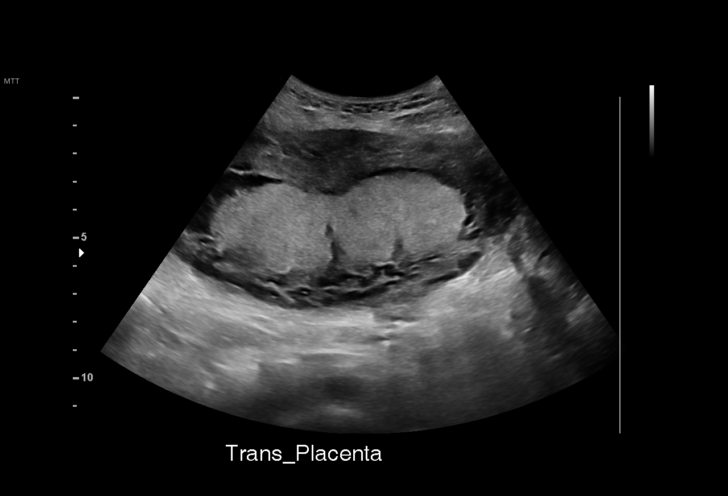
[im 23/120]
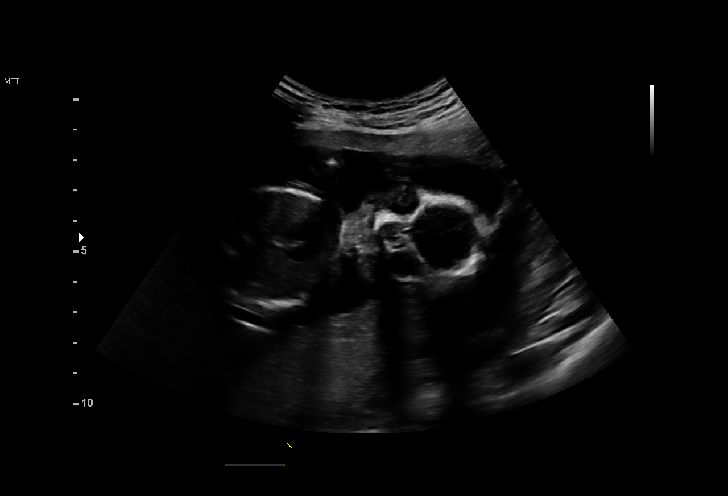
[im 31/120]
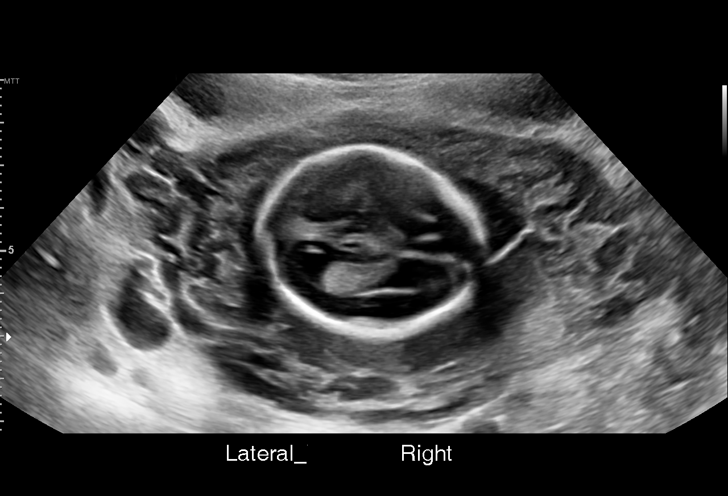
[im 40/120]
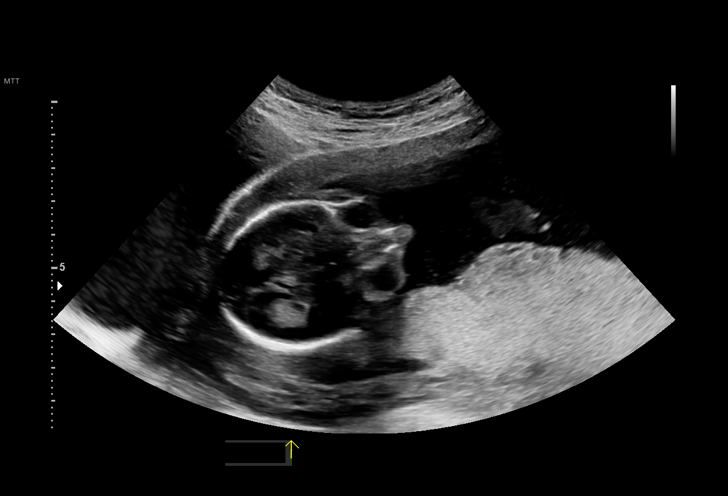
[im 49/120]
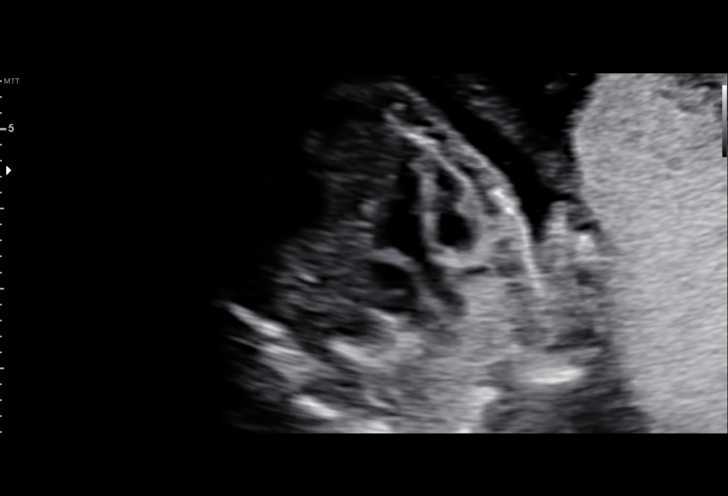
[im 62/120]
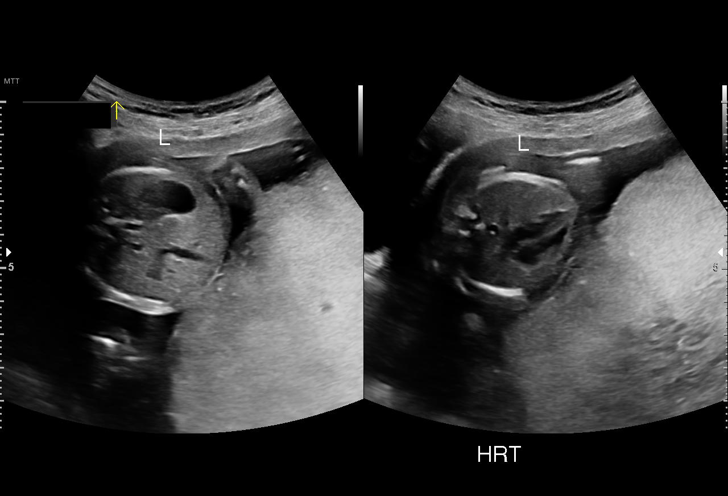
[im 71/120]
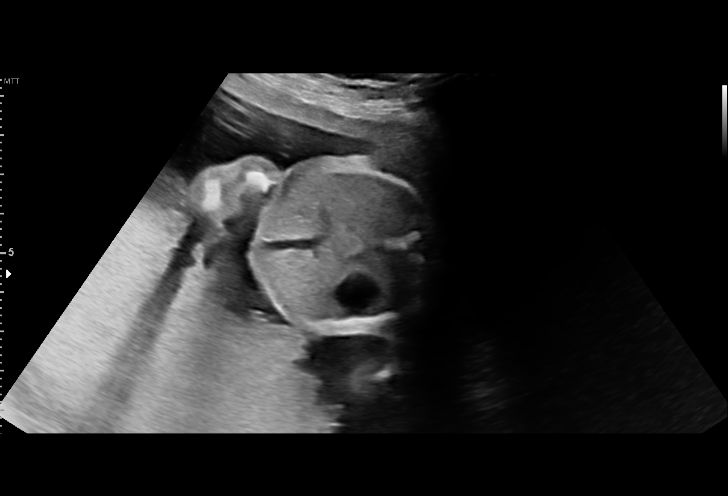
[im 80/120]
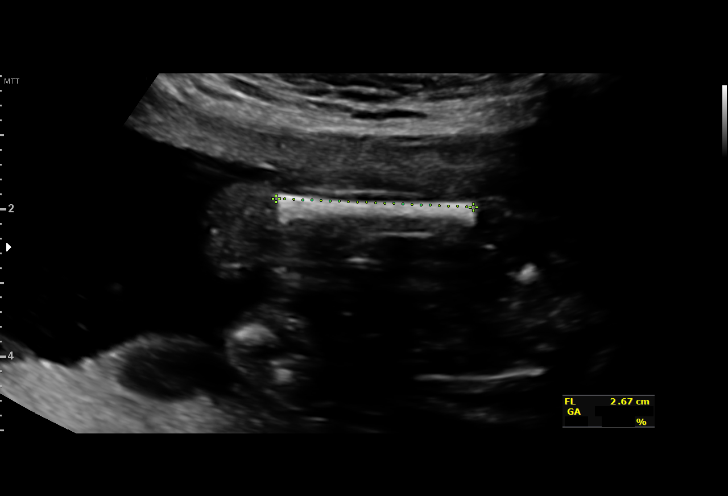
[im 89/120]
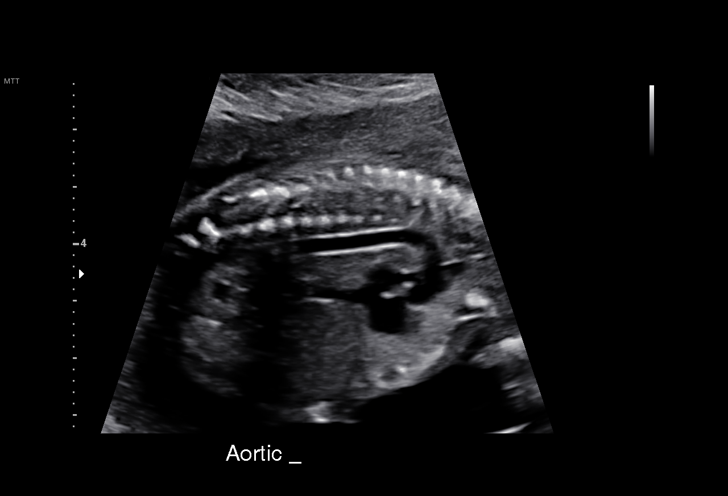
[im 97/120]
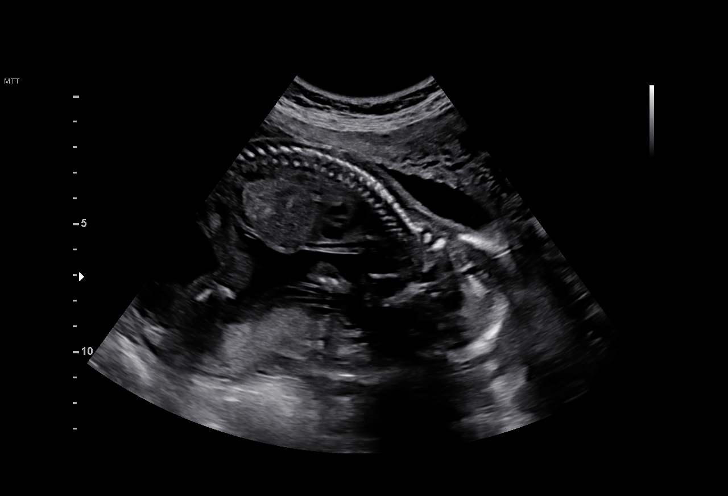
[im 106/120]
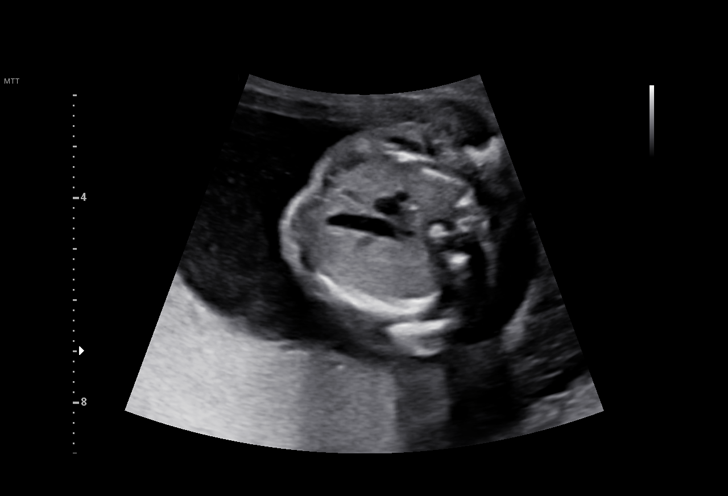
[im 115/120]
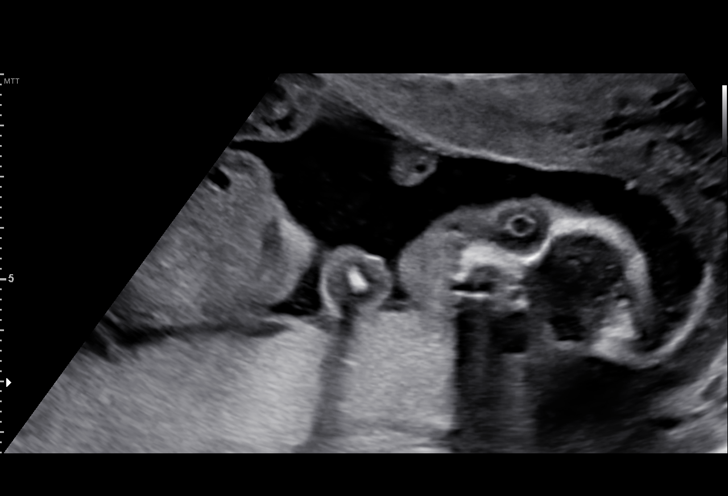

[13 of 28 positions shown; findings below may reference images not displayed]

1  US MFM OB COMP + 14 WK               76805.01     DA-D MOJAMMEL
 ----------------------------------------------------------------------

 ----------------------------------------------------------------------
Indications

  Size-Date Discrepancy
  19 weeks gestation of pregnancy
  Encounter for antenatal screening for
  malformations
 ----------------------------------------------------------------------
Fetal Evaluation

 Num Of Fetuses:         1
 Fetal Heart Rate(bpm):  118
 Cardiac Activity:       Observed
 Presentation:           Cephalic
 Placenta:               Posterior
 P. Cord Insertion:      Visualized, central

 Amniotic Fluid
 AFI FV:      Within normal limits

                             Largest Pocket(cm)

Biometry

 BPD:      42.9  mm     G. Age:  19w 0d         50  %    CI:        75.98   %    70 - 86
                                                         FL/HC:      17.4   %    16.1 -
 HC:       156   mm     G. Age:  18w 4d         21  %    HC/AC:      1.06        1.09 -
 AC:      146.5  mm     G. Age:  20w 0d         76  %    FL/BPD:     63.2   %
 FL:       27.1  mm     G. Age:  18w 2d         19  %    FL/AC:      18.5   %    20 - 24
 HUM:        26  mm     G. Age:  18w 1d         28  %
 CER:      19.2  mm     G. Age:  18w 4d         38  %
 NFT:       4.9  mm

 LV:        7.3  mm
 CM:        3.1  mm

 Est. FW:     274  gm    0 lb 10 oz      52  %
OB History

 Blood Type:    A+
 Gravidity:    2         Term:   1        Prem:   0        SAB:   0
 TOP:          0       Ectopic:  0        Living: 1
Gestational Age

 LMP:           18w 0d        Date:  10/05/19                 EDD:   07/11/20
 Clinical EDD:  19w 0d                                        EDD:   07/04/20
 U/S Today:     19w 0d                                        EDD:   07/04/20
 Best:          19w 0d     Det. By:  U/S (02/08/20)           EDD:   07/04/20
Anatomy

 Cranium:               Appears normal         LVOT:                   Appears normal
 Cavum:                 Appears normal         Aortic Arch:            Appears normal
 Ventricles:            Appears normal         Ductal Arch:            Not well visualized
 Choroid Plexus:        Appears normal         Diaphragm:              Appears normal
 Cerebellum:            Appears normal         Stomach:                Appears normal, left
                                                                       sided
 Posterior Fossa:       Appears normal         Abdomen:                Appears normal
 Nuchal Fold:           Appears normal         Abdominal Wall:         Appears nml (cord
                                                                       insert, abd wall)
 Face:                  Appears normal         Cord Vessels:           Appears normal (3
                        (orbits and profile)                           vessel cord)
 Lips:                  Appears normal         Kidneys:                Appear normal
 Palate:                Not well visualized    Bladder:                Appears normal
 Thoracic:              Appears normal         Spine:                  Appears normal
 Heart:                 Appears normal         Upper Extremities:      Appears normal
                        (4CH, axis, and
                        situs)
 RVOT:                  Appears normal         Lower Extremities:      Appears normal

 Other:  Fetus appears to be a male. Heels and Left 5th digit visualized. Nasal
         bone visualized. Open hands visualized. Technically difficult due to
         fetal position.
Cervix Uterus Adnexa

 Cervix
 Length:           3.78  cm.
 Normal appearance by transabdominal scan.

 Uterus
 No abnormality visualized.

 Left Ovary
 Within normal limits. No adnexal mass visualized.

 Right Ovary
 Within normal limits. No adnexal mass visualized.

 Cul De Sac
 No free fluid seen.

 Adnexa
 No abnormality visualized.
Comments

 This patient was seen for a detailed fetal anatomy scan.
 She denies any significant past medical history and denies
 any problems in her current pregnancy.
 The patient reports that she had a cell free DNA test earlier in
 her pregnancy which indicated a low risk for trisomy 21, 18,
 and 13. A male fetus is predicted.  We could not find the
 results of this test in [REDACTED] today.
 She was informed that the fetal growth and amniotic fluid
 level were appropriate for her gestational age.
 There were no obvious fetal anomalies noted on today's
 ultrasound exam.
 The patient was informed that anomalies may be missed due
 to technical limitations. If the fetus is in a suboptimal position
 or maternal habitus is increased, visualization of the fetus in
 the maternal uterus may be impaired.
 Follow up as indicated.

## 2021-05-30 ENCOUNTER — Ambulatory Visit: Payer: No Typology Code available for payment source | Admitting: Physician Assistant

## 2021-05-30 ENCOUNTER — Telehealth: Payer: No Typology Code available for payment source | Admitting: Physician Assistant

## 2022-01-02 ENCOUNTER — Telehealth: Payer: No Typology Code available for payment source | Admitting: Physician Assistant

## 2022-01-02 ENCOUNTER — Encounter: Payer: Self-pay | Admitting: Physician Assistant

## 2022-01-02 VITALS — Temp 100.3°F | Ht 64.0 in | Wt 115.0 lb

## 2022-01-02 DIAGNOSIS — R509 Fever, unspecified: Secondary | ICD-10-CM

## 2022-01-02 DIAGNOSIS — J029 Acute pharyngitis, unspecified: Secondary | ICD-10-CM | POA: Diagnosis not present

## 2022-01-02 DIAGNOSIS — Z20818 Contact with and (suspected) exposure to other bacterial communicable diseases: Secondary | ICD-10-CM

## 2022-01-02 MED ORDER — AMOXICILLIN 875 MG PO TABS: 875.0000 mg | ORAL_TABLET | Freq: Two times a day (BID) | ORAL | 0 refills | Status: AC

## 2022-01-02 NOTE — Progress Notes (Signed)
Pt reports sore throat and diarrhea and nausea  pt states low appetite  and yellow mucus and sinus drainage onset for 2 days .

## 2022-01-02 NOTE — Progress Notes (Signed)
..Virtual Visit via Video Note  I connected with Dana Eaton on 01/02/22 at  3:20 PM EST by a video enabled telemedicine application and verified that I am speaking with the correct person using two identifiers.  Location: Patient: home Provider: clinic  .Marland KitchenParticipating in visit:  Patient: Dana Eaton Provider: Tandy Gaw PA-C Provider in training: Shawna Orleans PA-S   I discussed the limitations of evaluation and management by telemedicine and the availability of in person appointments. The patient expressed understanding and agreed to proceed.  History of Present Illness: Pt is a 36 yo female with 3 days of ST, fever, fatigue, aches and some congestion. Both of her kids tested positive for strep this morning. She is just feeling so worn down. No SOB. Has not tested for covid. Taking OTC tylenol and ibuprofen with some help. It is mostly her throat that is so painful. She has no appetitie. No SOB. She is clearing her throat but no cough.   .. Active Ambulatory Problems    Diagnosis Date Noted   Family history of osteoporosis 08/17/2014   Diastasis recti 05/02/2018   Migraine without aura and without status migrainosus, not intractable 07/28/2019   Term pregnancy 07/08/2020   Umbilical hernia 07/08/2020   Nonpurulent mastitis associated with lactation 08/20/2020   Resolved Ambulatory Problems    Diagnosis Date Noted   Annual physical exam 08/17/2014   Right hip pain 08/17/2014   Tinea corporis 08/17/2014   Supervision of normal first pregnancy, antepartum 02/21/2016   Carpal tunnel syndrome during pregnancy 08/27/2016   Dependent edema 08/27/2016   Normal labor 10/03/2016   De Quervain's tenosynovitis, left 11/15/2016   Viral syndrome 06/07/2017   Mastitis 06/07/2017   Dyspepsia and disorder of function of stomach 05/02/2018   Right lower quadrant abdominal pain 05/02/2018   Generalized abdominal pain 07/10/2019   Eosinophilia 07/12/2019   Recurrent fever of unknown  etiology 07/28/2019   Supervision of other normal pregnancy, antepartum 12/14/2019   Past Medical History:  Diagnosis Date   Anemia    History of intestinal parasite        Observations/Objective: No acute distress No cough noted Normal appearance with no concerns  .Marland Kitchen Today's Vitals   01/02/22 1500  Temp: 100.3 F (37.9 C)  TempSrc: Oral  Weight: 115 lb (52.2 kg)  Height: 5\' 4"  (1.626 m)   Body mass index is 19.74 kg/m.   Assessment and Plan: Marland KitchenMarland Kitchen was seen today for sore throat.  Diagnoses and all orders for this visit:  Exposure to strep throat -     amoxicillin (AMOXIL) 875 MG tablet; Take 1 tablet (875 mg total) by mouth 2 (two) times daily for 10 days.  Sore throat -     amoxicillin (AMOXIL) 875 MG tablet; Take 1 tablet (875 mg total) by mouth 2 (two) times daily for 10 days.  Fever, unspecified fever cause -     amoxicillin (AMOXIL) 875 MG tablet; Take 1 tablet (875 mg total) by mouth 2 (two) times daily for 10 days.   Her kids are positive for strep.  She has symptoms Treated empirically with amoxicillin.  Continue ibuprofen and tylenol Follow up as needed or if symptoms persist.  Written out of work for today and tomorrow.      Follow Up Instructions:    I discussed the assessment and treatment plan with the patient. The patient was provided an opportunity to ask questions and all were answered. The patient agreed with the plan and demonstrated an understanding of  the instructions.   The patient was advised to call back or seek an in-person evaluation if the symptoms worsen or if the condition fails to improve as anticipated.   Tandy Gaw, PA-C

## 2022-09-17 ENCOUNTER — Ambulatory Visit: Payer: No Typology Code available for payment source | Admitting: Sports Medicine

## 2022-09-25 ENCOUNTER — Ambulatory Visit: Payer: No Typology Code available for payment source | Admitting: Sports Medicine

## 2022-09-28 ENCOUNTER — Ambulatory Visit: Payer: No Typology Code available for payment source | Admitting: Sports Medicine

## 2022-09-28 ENCOUNTER — Encounter: Payer: Self-pay | Admitting: Sports Medicine

## 2022-09-28 DIAGNOSIS — S39012D Strain of muscle, fascia and tendon of lower back, subsequent encounter: Secondary | ICD-10-CM

## 2022-09-28 DIAGNOSIS — R41840 Attention and concentration deficit: Secondary | ICD-10-CM | POA: Insufficient documentation

## 2022-09-28 DIAGNOSIS — S39012A Strain of muscle, fascia and tendon of lower back, initial encounter: Secondary | ICD-10-CM | POA: Insufficient documentation

## 2022-09-28 NOTE — Progress Notes (Signed)
    Procedures performed today:    None.  Independent interpretation of notes and tests performed by another provider:   None.  Brief History, Exam, Impression, and Recommendations:    Difficulty concentrating This is a pleasant 36 year old female, mother of a 11 and a 37-year-old, works from home in [place].  She has noted increasing difficulty staying on task, focusing, as well as lack of energy. She is wondering if she might have ADHD. She describes her mood as happy, with only a bit of the expected anxiety. I did advise her that a diagnosis of ADHD was multidisciplinary, I would like her to get a neuropsychological evaluation for ADHD, and I would also like her to establish care with one of the primary care providers here for consideration of treatment after the evaluation.  Lumbar strain Did a heavy workout in the gym, afterwards had pain right quadratus lumborum worse with most movements. Was seen in urgent care, treated conservatively and symptoms improved. I have recommended a core conditioning program, she can return to see me as needed for this.    ____________________________________________ Gwen Her. Dianah Field, M.D., ABFM., CAQSM., AME. Primary Care and Sports Medicine [facility]  Adjunct Professor of [employer]  Risk manager

## 2022-09-28 NOTE — Assessment & Plan Note (Signed)
This is a pleasant 36 year old female, mother of a 41 and a 38-year-old, works from home in [place].  She has noted increasing difficulty staying on task, focusing, as well as lack of energy. She is wondering if she might have ADHD. She describes her mood as happy, with only a bit of the expected anxiety. I did advise her that a diagnosis of ADHD was multidisciplinary, I would like her to get a neuropsychological evaluation for ADHD, and I would also like her to establish care with one of the primary care providers here for consideration of treatment after the evaluation.

## 2022-09-28 NOTE — Assessment & Plan Note (Signed)
Did a heavy workout in the gym, afterwards had pain right quadratus lumborum worse with most movements. Was seen in urgent care, treated conservatively and symptoms improved. I have recommended a core conditioning program, she can return to see me as needed for this.

## 2022-10-31 ENCOUNTER — Ambulatory Visit: Payer: No Typology Code available for payment source | Admitting: Family Medicine

## 2022-10-31 ENCOUNTER — Encounter: Payer: Self-pay | Admitting: Family Medicine

## 2022-10-31 VITALS — BP 119/76 | HR 65 | Ht 64.0 in | Wt 113.0 lb

## 2022-10-31 DIAGNOSIS — R41840 Attention and concentration deficit: Secondary | ICD-10-CM | POA: Diagnosis not present

## 2022-10-31 NOTE — Assessment & Plan Note (Signed)
-   discussed with patient that she needs to get formal testing. Provided number for the referral department for psychiatry  - also provided patient with website for adhd testing - discussed that adhd is a complex diagnosis and we need formal testing to make sure we don't have any underlying medical diagnosis that could be causing concentration issues.

## 2022-10-31 NOTE — Patient Instructions (Addendum)
[email or web address]   ADHD Referral was sent to:  [facility] at [phone] and [phone].

## 2022-10-31 NOTE — Progress Notes (Signed)
     Established patient visit   Patient: Dana Eaton   DOB: 26-Jun-1986   36 y.o. Female  MRN: [id number] Visit Date: 10/31/2022  Today's healthcare provider: Owens Loffler, DO   Chief Complaint  Patient presents with   Dana Eaton    Chief Complaint  Patient presents with   Establish Care   HPI  Pt is here to establish care. She recently met with Dr. Darene Lamer about concerns for ADHD testing and has not received a phone call about this. She feels like she can't complete task. She gets easily distracted. She feels like she focuses too hard to compensate. She feels like her symptoms started last October and has progressively gotten worse in the past 6 months. She denies any trauma or triggers. She has been in the same job.   Review of Systems  Constitutional:  Negative for activity change, fatigue and fever.  Respiratory:  Negative for cough and shortness of breath.   Cardiovascular:  Negative for chest pain.  Gastrointestinal:  Negative for abdominal pain.  Genitourinary:  Negative for difficulty urinating.       No outpatient medications have been marked as taking for the 10/31/22 encounter (Office Visit) with Owens Loffler, DO.    OBJECTIVE    BP 119/76   Pulse 65   Ht _0  (1.626 m)   Wt 113 lb (51.3 kg)   SpO2 99%   BMI 19.40 kg/m   Physical Exam Vitals and nursing note reviewed.  Constitutional:      General: She is not in acute distress.    Appearance: Normal appearance.  HENT:     Head: Normocephalic and atraumatic.     Right Ear: External ear normal.     Left Ear: External ear normal.     Nose: Nose normal.  Eyes:     Conjunctiva/sclera: Conjunctivae normal.  Cardiovascular:     Rate and Rhythm: Normal rate.  Pulmonary:     Effort: Pulmonary effort is normal.  Neurological:     General: No focal deficit present.     Mental Status: She is alert and oriented to person, place, and time.  Psychiatric:        Mood and Affect: Mood  normal.        Behavior: Behavior normal.        Thought Content: Thought content normal.        Judgment: Judgment normal.          ASSESSMENT & PLAN    Problem List Items Addressed This Visit       Other   Difficulty concentrating - Primary    - discussed with patient that she needs to get formal testing. Provided number for the referral department for psychiatry  - also provided patient with website for adhd testing - discussed that adhd is a complex diagnosis and we need formal testing to make sure we don't have any underlying medical diagnosis that could be causing concentration issues.        Return once adhd testing has been completed.      No orders of the defined types were placed in this encounter.   No orders of the defined types were placed in this encounter.    Owens Loffler, DO  [facility] [phone] (phone) [phone] (fax)  [place]

## 2022-12-28 ENCOUNTER — Encounter: Payer: Self-pay | Admitting: Sports Medicine

## 2022-12-28 ENCOUNTER — Ambulatory Visit: Payer: No Typology Code available for payment source | Admitting: Sports Medicine

## 2022-12-28 VITALS — BP 112/65 | HR 71 | Temp 98.1°F

## 2022-12-28 DIAGNOSIS — R509 Fever, unspecified: Secondary | ICD-10-CM

## 2022-12-28 DIAGNOSIS — R739 Hyperglycemia, unspecified: Secondary | ICD-10-CM | POA: Diagnosis not present

## 2022-12-28 DIAGNOSIS — B349 Viral infection, unspecified: Secondary | ICD-10-CM | POA: Diagnosis not present

## 2022-12-28 DIAGNOSIS — J029 Acute pharyngitis, unspecified: Secondary | ICD-10-CM

## 2022-12-28 LAB — POCT INFLUENZA A/B
Influenza A, POC: NEGATIVE
Influenza B, POC: NEGATIVE

## 2022-12-28 LAB — POC COVID19 BINAXNOW: SARS Coronavirus 2 Ag: NEGATIVE

## 2022-12-28 MED ORDER — AZELASTINE-FLUTICASONE 137-50 MCG/ACT NA SUSP: NASAL | 11 refills | Status: DC

## 2022-12-28 MED ORDER — AZITHROMYCIN 250 MG PO TABS: ORAL_TABLET | ORAL | 0 refills | Status: DC

## 2022-12-28 NOTE — Progress Notes (Signed)
    Procedures performed today:    None.  Independent interpretation of notes and tests performed by another provider:   None.  Brief History, Exam, Impression, and Recommendations:    Viral syndrome Pleasant 37 year old female, she has had 2 weeks now of on and off low-grade fevers up to about 100 degrees. She has some runny nose, postnasal drip, but really no cough, sore throat, headaches, muscle aches, body aches, no shortness of breath. COVID and flu swabs today were negative. No new abdominal pains though she does have some minimal periumbilical discomfort. No nausea, vomiting, diarrhea, no dysuria, urgency or frequency. Exam is normal with the exception of erythematous oropharynx. She also has shotty cervical lymphadenopathy. As she has had 2 weeks of symptoms we will going to add a course of azithromycin, she does desire some blood work.    ____________________________________________ Gwen Her. Dianah Field, M.D., ABFM., CAQSM., AME. Primary Care and Sports Medicine [facility]  Adjunct Professor of [employer] [facility] [employer]  Risk manager

## 2022-12-28 NOTE — Assessment & Plan Note (Addendum)
Pleasant 37 year old female, she has had 2 weeks now of on and off low-grade fevers up to about 100 degrees. She has some runny nose, postnasal drip, but really no cough, sore throat, headaches, muscle aches, body aches, no shortness of breath. COVID and flu swabs today were negative. No new abdominal pains though she does have some minimal periumbilical discomfort. No nausea, vomiting, diarrhea, no dysuria, urgency or frequency. Exam is normal with the exception of erythematous oropharynx. She also has shotty cervical lymphadenopathy. As she has had 2 weeks of symptoms we will going to add a course of azithromycin, she does desire some blood work.

## 2023-01-04 LAB — LIPID PANEL
Cholesterol: 149 mg/dL (ref <200)
HDL: 72 mg/dL (ref >=50)
LDL Cholesterol (Calc): 64 mg/dL (calc)
Non-HDL Cholesterol (Calc): 77 mg/dL (calc) (ref <130)
Total CHOL/HDL Ratio: 2.1 (calc) (ref <5.0)
Triglycerides: 53 mg/dL (ref <150)

## 2023-01-04 LAB — CBC WITH DIFFERENTIAL/PLATELET
Absolute Monocytes: 540 cells/uL (ref 200–950)
Basophils Absolute: 54 cells/uL (ref 0–200)
Basophils Relative: 0.6 %
Eosinophils Absolute: 243 cells/uL (ref 15–500)
Eosinophils Relative: 2.7 %
HCT: 37.5 % (ref 35.0–45.0)
Hemoglobin: 12.9 g/dL (ref 11.7–15.5)
Lymphs Abs: 2646 cells/uL (ref 850–3900)
MCH: 32.2 pg (ref 27.0–33.0)
MCHC: 34.4 g/dL (ref 32.0–36.0)
MCV: 93.5 fL (ref 80.0–100.0)
MPV: 11.5 fL (ref 7.5–12.5)
Monocytes Relative: 6 %
Neutro Abs: 5517 cells/uL (ref 1500–7800)
Neutrophils Relative %: 61.3 %
Platelets: 267 10*3/uL (ref 140–400)
RBC: 4.01 10*6/uL (ref 3.80–5.10)
RDW: 11.3 % (ref 11.0–15.0)
Total Lymphocyte: 29.4 %
WBC: 9 10*3/uL (ref 3.8–10.8)

## 2023-01-04 LAB — COMPLETE METABOLIC PANEL WITH GFR
AG Ratio: 1.9 (calc) (ref 1.0–2.5)
ALT: 17 U/L (ref 6–29)
AST: 15 U/L (ref 10–30)
Albumin: 4.9 g/dL (ref 3.6–5.1)
Alkaline phosphatase (APISO): 41 U/L (ref 31–125)
BUN: 17 mg/dL (ref 7–25)
CO2: 31 mmol/L (ref 20–32)
Calcium: 9.4 mg/dL (ref 8.6–10.2)
Chloride: 102 mmol/L (ref 98–110)
Creat: 0.7 mg/dL (ref 0.50–0.97)
Globulin: 2.6 g/dL (calc) (ref 1.9–3.7)
Glucose, Bld: 68 mg/dL (ref 65–99)
Potassium: 3.9 mmol/L (ref 3.5–5.3)
Sodium: 143 mmol/L (ref 135–146)
Total Bilirubin: 0.3 mg/dL (ref 0.2–1.2)
Total Protein: 7.5 g/dL (ref 6.1–8.1)
eGFR: 116 mL/min/{1.73_m2} (ref >=60)

## 2023-01-04 LAB — QUANTIFERON-TB GOLD PLUS
Mitogen-NIL: >10 IU/mL
NIL: 0.03 IU/mL
QuantiFERON-TB Gold Plus: NEGATIVE
TB1-NIL: 0 IU/mL
TB2-NIL: 0 IU/mL

## 2023-01-04 LAB — PATHOLOGIST SMEAR REVIEW

## 2023-01-04 LAB — HEMOGLOBIN A1C
Hgb A1c MFr Bld: 5.4 % of total Hgb (ref <5.7)
Mean Plasma Glucose: 108 mg/dL
eAG (mmol/L): 6 mmol/L

## 2023-01-04 LAB — EPSTEIN-BARR VIRUS VCA, IGG: EBV VCA IgG: 397 U/mL — ABNORMAL HIGH

## 2023-01-04 LAB — MONONUCLEOSIS SCREEN: Heterophile, Mono Screen: NEGATIVE

## 2023-01-04 LAB — TSH: TSH: 0.92 mIU/L

## 2023-01-04 LAB — EPSTEIN-BARR VIRUS VCA, IGM: EBV VCA IgM: <36 U/mL

## 2023-01-04 LAB — CMV IGM: CMV IgM: <30 AU/mL

## 2023-01-04 LAB — RSV(RESPIRATORY SYNCYTIAL VIRUS) AB, BLOOD: RSV Antibodies: 1:16 {titer} — ABNORMAL HIGH

## 2023-01-11 ENCOUNTER — Ambulatory Visit: Payer: No Typology Code available for payment source | Admitting: Sports Medicine

## 2023-01-11 ENCOUNTER — Encounter: Payer: Self-pay | Admitting: Sports Medicine

## 2023-01-11 VITALS — BP 125/82 | HR 61

## 2023-01-11 DIAGNOSIS — B349 Viral infection, unspecified: Secondary | ICD-10-CM | POA: Diagnosis not present

## 2023-01-11 NOTE — Assessment & Plan Note (Signed)
Please see prior notes, [place] is doing a lot better, return as needed.

## 2023-01-11 NOTE — Progress Notes (Signed)
    Procedures performed today:    None.  Independent interpretation of notes and tests performed by another provider:   None.  Brief History, Exam, Impression, and Recommendations:    Viral syndrome Please see prior notes, [place] is doing a lot better, return as needed.    ____________________________________________ Gwen Her. Dianah Field, M.D., ABFM., CAQSM., AME. Primary Care and Sports Medicine [facility]  Adjunct Professor of [employer] [facility] of Medicine  Risk manager

## 2023-03-27 ENCOUNTER — Telehealth: Payer: Self-pay | Admitting: Family Medicine

## 2023-03-27 NOTE — Telephone Encounter (Signed)
Left message for patient to call back and schedule an appointment.

## 2023-03-27 NOTE — Telephone Encounter (Signed)
Patient called requesting an ANA test and a thyroid check.

## 2023-03-27 NOTE — Telephone Encounter (Signed)
Patient has been scheduled

## 2023-03-28 ENCOUNTER — Encounter: Payer: Self-pay | Admitting: Family Medicine

## 2023-03-28 ENCOUNTER — Ambulatory Visit: Payer: No Typology Code available for payment source | Admitting: Family Medicine

## 2023-03-28 VITALS — BP 115/67 | HR 81 | Ht 64.0 in | Wt 116.0 lb

## 2023-03-28 DIAGNOSIS — R5383 Other fatigue: Secondary | ICD-10-CM | POA: Diagnosis not present

## 2023-03-28 DIAGNOSIS — R4189 Other symptoms and signs involving cognitive functions and awareness: Secondary | ICD-10-CM | POA: Insufficient documentation

## 2023-03-28 LAB — CBC
HCT: 41.7 % (ref 35.0–45.0)
MCHC: 32.9 g/dL (ref 32.0–36.0)
Platelets: 232 10*3/uL (ref 140–400)
RBC: 4.37 10*6/uL (ref 3.80–5.10)
WBC: 6.8 10*3/uL (ref 3.8–10.8)

## 2023-03-28 NOTE — Assessment & Plan Note (Addendum)
-   pt presents today with concerns of brain fog and fatigue. She said these symptoms have been going on for a few months now. She was diagnosed with rsv three months ago. She was looking online and says she thinks she may have lupus. Does admit to some balance issues occasionally. Denies any vision changes or rashes.  - will go ahead and do complete metabolic work up first  - did let her know if this all comes back negative we will get her tested for adhd

## 2023-03-28 NOTE — Progress Notes (Signed)
Established patient visit   Patient: Dana Eaton   DOB: [birth year]   37 y.o. Female  MRN: [id number] Visit Date: 03/28/2023  Today's healthcare provider: Charlton Amor, DO   Chief Complaint  Patient presents with   Follow-up    SUBJECTIVE    Chief Complaint  Patient presents with   Follow-up   HPI  Pt presents with concerns of thyroid and autoimmune disease. She is having fatigue. She had to take time off work. Did not go for adhd testing.  Review of Systems  Constitutional:  Negative for activity change, fatigue and fever.  Respiratory:  Negative for cough and shortness of breath.   Cardiovascular:  Negative for chest pain.  Gastrointestinal:  Negative for abdominal pain.  Genitourinary:  Negative for difficulty urinating.       Current Meds  Medication Sig   ASHWAGANDHA 35 PO Take by mouth.   b complex vitamins capsule Take 1 capsule by mouth daily.   Multiple Vitamins-Minerals (IMMUNE SUPPORT PO) Take by mouth.   Omega-3 Fatty Acids (FISH OIL) 300 MG CAPS Take by mouth.    OBJECTIVE    BP 115/67   Pulse 81   Ht  (1.626 m)   Wt 116 lb (52.6 kg)   SpO2 99%   BMI 19.91 kg/m   Physical Exam Vitals and nursing note reviewed.  Constitutional:      General: She is not in acute distress.    Appearance: Normal appearance.  HENT:     Head: Normocephalic and atraumatic.     Right Ear: External ear normal.     Left Ear: External ear normal.     Nose: Nose normal.  Eyes:     Conjunctiva/sclera: Conjunctivae normal.  Cardiovascular:     Rate and Rhythm: Normal rate and regular rhythm.  Pulmonary:     Effort: Pulmonary effort is normal.     Breath sounds: Normal breath sounds.  Neurological:     General: No focal deficit present.     Mental Status: She is alert and oriented to person, place, and time.     Comments: CN II-XII intact bilaterally  Psychiatric:        Mood and Affect: Mood normal.        Behavior: Behavior normal.         Thought Content: Thought content normal.        Judgment: Judgment normal.          ASSESSMENT & PLAN    Problem List Items Addressed This Visit       Other   Other fatigue - Primary    - pt presents today with concerns of brain fog and fatigue. She said these symptoms have been going on for a few months now. She was diagnosed with rsv three months ago. She was looking online and says she thinks she may have lupus. Does admit to some balance issues occasionally. Denies any vision changes or rashes.  - will go ahead and do complete metabolic work up first  - did let her know if this all comes back negative we will get her tested for adhd      Relevant Orders   TSH + free T4   CBC   BASIC METABOLIC PANEL WITH GFR   Vitamin D (25 hydroxy)   Vitamin B12   Brain fog   Relevant Orders   ANA,IFA RA Diag Pnl w/rflx Tit/Patn    Return in about 3 weeks (around  04/18/2023).      No orders of the defined types were placed in this encounter.   Orders Placed This Encounter  Procedures   ANA,IFA RA Diag Pnl w/rflx Tit/Patn   TSH + free T4   CBC   BASIC METABOLIC PANEL WITH GFR   Vitamin D (25 hydroxy)   Vitamin B12     Charlton Amor, DO  [facility] Primary Care & Sports Medicine at [facility] [phone] (phone) [phone] (fax)  [facility]

## 2023-03-29 LAB — CBC
MCH: 31.4 pg (ref 27.0–33.0)
MCV: 95.4 fL (ref 80.0–100.0)
RDW: 11.2 % (ref 11.0–15.0)

## 2023-03-29 LAB — VITAMIN D 25 HYDROXY (VIT D DEFICIENCY, FRACTURES): Vit D, 25-Hydroxy: 49 ng/mL (ref 30–100)

## 2023-03-29 LAB — BASIC METABOLIC PANEL WITH GFR
CO2: 25 mmol/L (ref 20–32)
Chloride: 103 mmol/L (ref 98–110)
Creat: 0.6 mg/dL (ref 0.50–0.97)
eGFR: 121 mL/min/{1.73_m2} (ref >=60)

## 2023-03-29 LAB — VITAMIN B12: Vitamin B-12: 894 pg/mL (ref 200–1100)

## 2023-03-31 LAB — ANA,IFA RA DIAG PNL W/RFLX TIT/PATN
Anti Nuclear Antibody (ANA): POSITIVE — AB
Cyclic Citrullin Peptide Ab: <16 UNITS
Rheumatoid fact SerPl-aCnc: <10 IU/mL (ref <14)

## 2023-03-31 LAB — CBC
Hemoglobin: 13.7 g/dL (ref 11.7–15.5)
MPV: 12 fL (ref 7.5–12.5)

## 2023-03-31 LAB — BASIC METABOLIC PANEL WITH GFR
BUN: 14 mg/dL (ref 7–25)
Calcium: 9.2 mg/dL (ref 8.6–10.2)
Glucose, Bld: 80 mg/dL (ref 65–99)
Potassium: 4 mmol/L (ref 3.5–5.3)
Sodium: 140 mmol/L (ref 135–146)

## 2023-03-31 LAB — ANTI-NUCLEAR AB-TITER (ANA TITER): ANA Titer 1: 1:40 {titer} — ABNORMAL HIGH

## 2023-03-31 LAB — TSH+FREE T4: TSH W/REFLEX TO FT4: 0.98 mIU/L

## 2023-04-01 ENCOUNTER — Other Ambulatory Visit: Payer: Self-pay | Admitting: Family Medicine

## 2023-04-01 DIAGNOSIS — R768 Other specified abnormal immunological findings in serum: Secondary | ICD-10-CM

## 2023-04-03 ENCOUNTER — Encounter: Payer: Self-pay | Admitting: Family Medicine

## 2023-04-18 ENCOUNTER — Ambulatory Visit: Payer: No Typology Code available for payment source | Admitting: Family Medicine

## 2023-04-21 NOTE — Progress Notes (Unsigned)
     Established patient visit   Patient: Dana Eaton   DOB: 18-Dec-1985   37 y.o. Female  MRN: [id number] Visit Date: 04/22/2023  Today's healthcare provider: Charlton Amor, DO   No chief complaint on file.   SUBJECTIVE   No chief complaint on file.  HPI  Pt presents for follow up on fatigue.   Review of Systems     No outpatient medications have been marked as taking for the 04/22/23 encounter (Appointment) with Charlton Amor, DO.    OBJECTIVE    There were no vitals taken for this visit.  Physical Exam   {Show previous labs (optional):23736}    ASSESSMENT & PLAN    Problem List Items Addressed This Visit   None   No follow-ups on file.      No orders of the defined types were placed in this encounter.   No orders of the defined types were placed in this encounter.    Charlton Amor, DO  [facility] Primary Care & Sports Medicine at [facility] [phone] (phone) [phone] (fax)  [facility]

## 2023-04-22 ENCOUNTER — Ambulatory Visit: Payer: No Typology Code available for payment source | Admitting: Family Medicine

## 2023-04-22 ENCOUNTER — Encounter: Payer: Self-pay | Admitting: Family Medicine

## 2023-04-22 VITALS — BP 103/65 | HR 66 | Ht 64.0 in | Wt 115.8 lb

## 2023-04-22 DIAGNOSIS — F439 Reaction to severe stress, unspecified: Secondary | ICD-10-CM

## 2023-04-22 DIAGNOSIS — R41840 Attention and concentration deficit: Secondary | ICD-10-CM

## 2023-04-22 DIAGNOSIS — R197 Diarrhea, unspecified: Secondary | ICD-10-CM

## 2023-04-22 DIAGNOSIS — R636 Underweight: Secondary | ICD-10-CM

## 2023-04-22 NOTE — Assessment & Plan Note (Signed)
-   have sent referral to psychology for patient to discuss stress levels

## 2023-04-22 NOTE — Assessment & Plan Note (Signed)
-   sent referral for adhd testing

## 2023-04-22 NOTE — Assessment & Plan Note (Signed)
-   have gone ahead and ordered stool studies to evaluate for giardia and other parasites

## 2023-04-26 LAB — GIARDIA ANTIGEN: MICRO NUMBER:: 14989287

## 2023-04-27 LAB — OVA AND PARASITE EXAMINATION
CONCENTRATE RESULT:: NONE SEEN
MICRO NUMBER:: 14985373
SPECIMEN QUALITY:: ADEQUATE
TRICHROME RESULT:: NONE SEEN

## 2023-04-27 LAB — GIARDIA ANTIGEN
RESULT:: NOT DETECTED
SPECIMEN QUALITY:: ADEQUATE

## 2023-04-27 LAB — E. HISTOLYTICA ANTIBODY (AMOEBA AB): E histolytica Ab: NEGATIVE

## 2023-05-09 ENCOUNTER — Encounter: Payer: Self-pay | Admitting: Family Medicine

## 2023-05-20 ENCOUNTER — Encounter: Payer: Self-pay | Admitting: Family Medicine

## 2023-05-20 ENCOUNTER — Ambulatory Visit: Payer: No Typology Code available for payment source | Admitting: Family Medicine

## 2023-05-20 VITALS — BP 105/65 | HR 62 | Temp 98.9°F | Resp 18 | Ht 64.0 in | Wt 114.8 lb

## 2023-05-20 DIAGNOSIS — F411 Generalized anxiety disorder: Secondary | ICD-10-CM

## 2023-05-20 DIAGNOSIS — H9193 Unspecified hearing loss, bilateral: Secondary | ICD-10-CM | POA: Diagnosis not present

## 2023-05-20 DIAGNOSIS — R590 Localized enlarged lymph nodes: Secondary | ICD-10-CM | POA: Diagnosis not present

## 2023-05-20 MED ORDER — BUSPIRONE HCL 5 MG PO TABS: 5.0000 mg | ORAL_TABLET | Freq: Two times a day (BID) | ORAL | 0 refills | Status: DC | PRN

## 2023-05-20 NOTE — Patient Instructions (Addendum)
Cetirizine--daily zyrtec  Flonase nasal spray   ADHD testing [email or web address]

## 2023-05-20 NOTE — Progress Notes (Unsigned)
     Established patient visit   Patient: Dana Eaton   DOB: [birth year]   37 y.o. Female  MRN: [id number] Visit Date: 05/20/2023  Today's healthcare provider: Charlton Amor, DO   Chief Complaint  Patient presents with   Follow-up    SUBJECTIVE    Chief Complaint  Patient presents with   Follow-up   HPI  Pt presents for follow up. At last visit she was waiting to hear back from rheumatology about her 1:40 ana. She was under a lot of stress and referred to therapy and nutrition due to low weight.   Today, pt notes some cervical lymphadenopathy. She reports low grade 99-100.1 temps.   Has lost two pounds in last two months. Denies urinary symptoms  Review of Systems  Constitutional:  Negative for activity change, fatigue and fever.  Respiratory:  Negative for cough and shortness of breath.   Cardiovascular:  Negative for chest pain.  Gastrointestinal:  Negative for abdominal pain.  Genitourinary:  Negative for difficulty urinating.       No outpatient medications have been marked as taking for the 05/20/23 encounter (Office Visit) with Charlton Amor, DO.    OBJECTIVE    BP 105/65 (BP Location: Left Arm, Patient Position: Sitting, Cuff Size: Normal)   Pulse 62   Temp 98.9 F (37.2 C) (Oral)   Resp 18   Ht 5\' 4"  (1.626 m)   Wt 114 lb 12 oz (52.1 kg)   BMI 19.70 kg/m   Physical Exam Vitals and nursing note reviewed.  Constitutional:      General: She is not in acute distress.    Appearance: Normal appearance.  HENT:     Head: Normocephalic and atraumatic.     Right Ear: External ear normal.     Left Ear: External ear normal.     Nose: Nose normal.  Eyes:     Conjunctiva/sclera: Conjunctivae normal.  Cardiovascular:     Rate and Rhythm: Normal rate and regular rhythm.  Pulmonary:     Effort: Pulmonary effort is normal.     Breath sounds: Normal breath sounds.  Neurological:     General: No focal deficit present.     Mental Status: She is alert and  oriented to person, place, and time.  Psychiatric:        Mood and Affect: Mood normal.        Behavior: Behavior normal.        Thought Content: Thought content normal.        Judgment: Judgment normal.         ASSESSMENT & PLAN    Problem List Items Addressed This Visit   None   No follow-ups on file.      No orders of the defined types were placed in this encounter.   No orders of the defined types were placed in this encounter.    Charlton Amor, DO  [facility] Primary Care & Sports Medicine at [facility] [phone] (phone) [phone] (fax)  [facility]

## 2023-05-21 DIAGNOSIS — F411 Generalized anxiety disorder: Secondary | ICD-10-CM | POA: Insufficient documentation

## 2023-05-21 DIAGNOSIS — H9193 Unspecified hearing loss, bilateral: Secondary | ICD-10-CM | POA: Insufficient documentation

## 2023-05-21 DIAGNOSIS — R590 Localized enlarged lymph nodes: Secondary | ICD-10-CM | POA: Insufficient documentation

## 2023-05-21 NOTE — Assessment & Plan Note (Signed)
-   have sent referral to audiology for hearing test since patient says she had decreased hearing as a child and is noticing decreased hearing - ear exam normal with no cerumen impaction

## 2023-05-21 NOTE — Assessment & Plan Note (Signed)
-   extensive blood work done in the last two months that are unremarkable - did have EBV testing done a few months ago and patient showed she had antibodies - will order CT head and neck to further evaluate - not currently having fevers.

## 2023-05-21 NOTE — Assessment & Plan Note (Signed)
-   discussed buspar usage for anxiety for patient to take as needed

## 2023-05-24 ENCOUNTER — Other Ambulatory Visit: Payer: No Typology Code available for payment source

## 2023-05-27 ENCOUNTER — Other Ambulatory Visit: Payer: Self-pay | Admitting: Family Medicine

## 2023-06-04 ENCOUNTER — Ambulatory Visit: Payer: No Typology Code available for payment source | Admitting: Audiologist

## 2023-06-05 ENCOUNTER — Other Ambulatory Visit: Payer: No Typology Code available for payment source

## 2023-07-11 ENCOUNTER — Other Ambulatory Visit: Payer: Self-pay | Admitting: Family Medicine

## 2023-07-24 ENCOUNTER — Other Ambulatory Visit: Payer: Self-pay | Admitting: Family Medicine

## 2023-08-08 ENCOUNTER — Encounter: Payer: Self-pay | Admitting: Family Medicine

## 2023-08-12 ENCOUNTER — Ambulatory Visit: Payer: No Typology Code available for payment source | Admitting: Family Medicine

## 2023-08-12 ENCOUNTER — Encounter: Payer: Self-pay | Admitting: Family Medicine

## 2023-08-12 VITALS — BP 123/64 | HR 64 | Ht 64.0 in | Wt 113.6 lb

## 2023-08-12 DIAGNOSIS — R197 Diarrhea, unspecified: Secondary | ICD-10-CM | POA: Diagnosis not present

## 2023-08-12 MED ORDER — DICYCLOMINE HCL 10 MG PO CAPS: 10.0000 mg | ORAL_CAPSULE | Freq: Three times a day (TID) | ORAL | 1 refills | Status: AC

## 2023-08-12 MED ORDER — ONDANSETRON HCL 4 MG PO TABS: 4.0000 mg | ORAL_TABLET | Freq: Three times a day (TID) | ORAL | 0 refills | Status: AC | PRN

## 2023-08-12 NOTE — Progress Notes (Signed)
   Acute Office Visit  Subjective:     Patient ID: Dana Eaton, female    DOB: 30-Sep-1986, 37 y.o.   MRN: [id number]  Chief Complaint  Patient presents with   Diarrhea    x1wk    HPI Patient is in today for concerns of diarrhea. Recently visited her parents for her grandfathers funeral and has had loose stools since.   Review of Systems  Constitutional:  Negative for chills and fever.  Respiratory:  Negative for cough and shortness of breath.   Cardiovascular:  Negative for chest pain.  Gastrointestinal:  Positive for diarrhea.  Neurological:  Negative for headaches.        Objective:    BP 123/64   Pulse 64   Ht 5\' 4"  (1.626 m)   Wt 113 lb 9 oz (51.5 kg)   SpO2 100%   BMI 19.49 kg/m    Physical Exam Vitals and nursing note reviewed.  Constitutional:      General: She is not in acute distress.    Appearance: Normal appearance.  HENT:     Head: Normocephalic and atraumatic.     Right Ear: External ear normal.     Left Ear: External ear normal.     Nose: Nose normal.  Eyes:     Conjunctiva/sclera: Conjunctivae normal.  Cardiovascular:     Rate and Rhythm: Normal rate and regular rhythm.  Pulmonary:     Effort: Pulmonary effort is normal.     Breath sounds: Normal breath sounds.  Neurological:     General: No focal deficit present.     Mental Status: She is alert and oriented to person, place, and time.  Psychiatric:        Mood and Affect: Mood normal.        Behavior: Behavior normal.        Thought Content: Thought content normal.        Judgment: Judgment normal.     No results found for any visits on 08/12/23.      Assessment & Plan:   Problem List Items Addressed This Visit       Other   Diarrhea - Primary    - given bentyl to help with abdominal spasms - wondering if this is a gastroenteritis vs nervousness from family member death  - stool studies ordered. Patient has hx of giardia  - recommended BRAT diet and hydration        Relevant Orders   Cdiff NAA+O+P+Stool Culture   Ova and parasite examination    Meds ordered this encounter  Medications   dicyclomine (BENTYL) 10 MG capsule    Sig: Take 1 capsule (10 mg total) by mouth 4 (four) times daily -  before meals and at bedtime.    Dispense:  30 capsule    Refill:  1   ondansetron (ZOFRAN) 4 MG tablet    Sig: Take 1 tablet (4 mg total) by mouth every 8 (eight) hours as needed for nausea or vomiting.    Dispense:  20 tablet    Refill:  0    No follow-ups on file.  Charlton Amor, DO

## 2023-08-12 NOTE — Assessment & Plan Note (Addendum)
-   given bentyl to help with abdominal spasms - wondering if this is a gastroenteritis vs nervousness from family member death  - stool studies ordered. Patient has hx of giardia  - recommended BRAT diet and hydration

## 2023-08-12 NOTE — Patient Instructions (Signed)
BRAT diet+ bananas rice apple sauce toast

## 2023-08-16 LAB — OVA AND PARASITE EXAMINATION

## 2023-08-18 ENCOUNTER — Encounter: Payer: Self-pay | Admitting: Family Medicine

## 2023-08-18 LAB — CDIFF NAA+O+P+STOOL CULTURE
E coli, Shiga toxin Assay: NEGATIVE
Toxigenic C. Difficile by PCR: NEGATIVE

## 2023-08-19 ENCOUNTER — Other Ambulatory Visit: Payer: Self-pay | Admitting: Family Medicine

## 2023-08-19 DIAGNOSIS — R197 Diarrhea, unspecified: Secondary | ICD-10-CM

## 2023-08-20 ENCOUNTER — Other Ambulatory Visit: Payer: Self-pay | Admitting: Family Medicine

## 2023-08-20 DIAGNOSIS — R197 Diarrhea, unspecified: Secondary | ICD-10-CM

## 2023-11-26 ENCOUNTER — Other Ambulatory Visit: Payer: Self-pay | Admitting: Family Medicine

## 2023-12-10 ENCOUNTER — Other Ambulatory Visit: Payer: Self-pay | Admitting: Family Medicine

## 2023-12-24 ENCOUNTER — Other Ambulatory Visit: Payer: Self-pay | Admitting: Family Medicine

## 2024-04-01 ENCOUNTER — Ambulatory Visit: Admitting: Family Medicine

## 2024-04-01 ENCOUNTER — Encounter: Payer: Self-pay | Admitting: Family Medicine

## 2024-04-01 VITALS — BP 105/69 | HR 67 | Ht 64.0 in | Wt 113.0 lb

## 2024-04-01 DIAGNOSIS — H6993 Unspecified Eustachian tube disorder, bilateral: Secondary | ICD-10-CM | POA: Diagnosis not present

## 2024-04-01 MED ORDER — PREDNISONE 20 MG PO TABS: 20.0000 mg | ORAL_TABLET | Freq: Two times a day (BID) | ORAL | 0 refills | Status: AC

## 2024-04-01 NOTE — Patient Instructions (Signed)
 Start Flonase daily.  If not improving after a few days go ahead and start prednisone .

## 2024-04-01 NOTE — Assessment & Plan Note (Signed)
 Conitnue antihistamine.  Add flonase daily.  Start prednisone  if not improving after a few days.  Red flags and signs of infection reviewed with her.

## 2024-04-01 NOTE — Progress Notes (Signed)
 Dana Eaton - 38 y.o. female MRN [id number]  Date of birth: 1986-06-29  Subjective Chief Complaint  Patient presents with   Ear Fullness    HPI Dana Eaton is a 38 y.o. female here today with complaint of fluid and pressure in her ears.  Recently on a  cruise and noted this some while on vacation, worsened after flying back.  She has tried nasal saline and benadryl  with only mild relief.  She denies pain in the ears.  She has not noted drainage, fever or dizziness.   ROS:  A comprehensive ROS was completed and negative except as noted per HPI  No Known Allergies  Past Medical History:  Diagnosis Date   Anemia    History of intestinal parasite     Past Surgical History:  Procedure Laterality Date   MANDIBLE SURGERY      Social History   Socioeconomic History   Marital status: Married    Spouse name: Not on file   Number of children: Not on file   Years of education: Not on file   Highest education level: Bachelor's degree (e.g., BA, AB, BS)  Occupational History   Not on file  Tobacco Use   Smoking status: Never   Smokeless tobacco: Never   Tobacco comments:    tried it once before-smoking   Vaping Use   Vaping status: Never Used  Substance and Sexual Activity   Alcohol use: Not Currently    Alcohol/week: 0.0 standard drinks of alcohol   Drug use: No   Sexual activity: Yes    Partners: Male    Birth control/protection: None  Other Topics Concern   Not on file  Social History Narrative   Not on file   Social Drivers of Health   Financial Resource Strain: Low Risk  (04/18/2023)   Overall Financial Resource Strain (CARDIA)    Difficulty of Paying Living Expenses: Not hard at all  Food Insecurity: No Food Insecurity (04/18/2023)   Hunger Vital Sign    Worried About Running Out of Food in the Last Year: Never true    Ran Out of Food in the Last Year: Never true  Transportation Needs: No Transportation Needs (04/18/2023)   PRAPARE - Therapist, art (Medical): No    Lack of Transportation (Non-Medical): No  Physical Activity: Insufficiently Active (04/18/2023)   Exercise Vital Sign    Days of Exercise per Week: 3 days    Minutes of Exercise per Session: 30 min  Stress: Stress Concern Present (04/18/2023)   [employer] of Occupational Health - Occupational Stress Questionnaire    Feeling of Stress : Rather much  Social Connections: Unknown (08/27/2023)   Received from [facility]   Social Network    Social Network: Not on file    Family History  Problem Relation Age of Onset   Osteoarthritis Maternal Grandmother    Osteoporosis Maternal Grandmother    Pancreatic cancer Maternal Grandmother 76   Hypertension Maternal Grandmother    Seizures Father    Seizures Sister    Colon cancer Paternal Grandmother    Hypertension Paternal Grandmother    Hypertension Maternal Grandfather    Hypertension Paternal Grandfather    Esophageal cancer Neg Hx    Rectal cancer Neg Hx    Stomach cancer Neg Hx     Health Maintenance  Topic Date Due   Hepatitis C Screening  Never done   COVID-19 Vaccine (1 - 2024-25 season) Never done  Cervical Cancer Screening (HPV/Pap Cotest)  12/12/2023   INFLUENZA VACCINE  07/03/2024   DTaP/Tdap/Td (4 - Td or Tdap) 04/25/2030   HIV Screening  Completed   HPV VACCINES  Aged Out   Meningococcal B Vaccine  Aged Out     ----------------------------------------------------------------------------------------------------------------------------------------------------------------------------------------------------------------- Physical Exam BP 105/69 (BP Location: Left Arm, Patient Position: Sitting, Cuff Size: Small)   Pulse 67   Ht 5\' 4"  (1.626 m)   Wt 113 lb (51.3 kg)   SpO2 99%   BMI 19.40 kg/m   Physical Exam Constitutional:      Appearance: Normal appearance.  HENT:     Head: Normocephalic and atraumatic.     Ears:     Comments: Serous appearing fluid  bilaterally, L>R Eyes:     General: No scleral icterus. Cardiovascular:     Rate and Rhythm: Normal rate and regular rhythm.  Pulmonary:     Effort: Pulmonary effort is normal.     Breath sounds: Normal breath sounds.  Neurological:     Mental Status: She is alert.  Psychiatric:        Mood and Affect: Mood normal.        Behavior: Behavior normal.     ------------------------------------------------------------------------------------------------------------------------------------------------------------------------------------------------------------------- Assessment and Plan  Eustachian tube dysfunction, bilateral Conitnue antihistamine.  Add flonase daily.  Start prednisone  if not improving after a few days.  Red flags and signs of infection reviewed with her.    Meds ordered this encounter  Medications   predniSONE  (DELTASONE ) 20 MG tablet    Sig: Take 1 tablet (20 mg total) by mouth 2 (two) times daily with a meal for 5 days.    Dispense:  10 tablet    Refill:  0    No follow-ups on file.

## 2024-04-09 ENCOUNTER — Encounter: Payer: Self-pay | Admitting: Family Medicine

## 2024-05-05 ENCOUNTER — Other Ambulatory Visit: Payer: Self-pay | Admitting: Family Medicine

## 2024-06-22 ENCOUNTER — Other Ambulatory Visit: Payer: Self-pay

## 2024-07-17 ENCOUNTER — Encounter: Admitting: Urgent Care

## 2024-07-24 ENCOUNTER — Encounter: Payer: Self-pay | Admitting: Urgent Care

## 2024-07-24 ENCOUNTER — Ambulatory Visit (INDEPENDENT_AMBULATORY_CARE_PROVIDER_SITE_OTHER): Admitting: Urgent Care

## 2024-07-24 VITALS — BP 116/73 | HR 60 | Resp 18 | Ht 64.0 in | Wt 114.8 lb

## 2024-07-24 DIAGNOSIS — Z0000 Encounter for general adult medical examination without abnormal findings: Secondary | ICD-10-CM | POA: Diagnosis not present

## 2024-07-24 DIAGNOSIS — F411 Generalized anxiety disorder: Secondary | ICD-10-CM | POA: Diagnosis not present

## 2024-07-24 DIAGNOSIS — R5383 Other fatigue: Secondary | ICD-10-CM | POA: Diagnosis not present

## 2024-07-24 DIAGNOSIS — R768 Other specified abnormal immunological findings in serum: Secondary | ICD-10-CM

## 2024-07-24 DIAGNOSIS — R590 Localized enlarged lymph nodes: Secondary | ICD-10-CM | POA: Diagnosis not present

## 2024-07-24 DIAGNOSIS — R4189 Other symptoms and signs involving cognitive functions and awareness: Secondary | ICD-10-CM

## 2024-07-24 MED ORDER — BUPROPION HCL ER (XL) 150 MG PO TB24: 150.0000 mg | ORAL_TABLET | ORAL | 1 refills | Status: AC

## 2024-07-24 NOTE — Patient Instructions (Signed)
 We completed our annual exam today. We obtained your labs.  Please start wellbutrin  once daily.  Please schedule a 4-6 week follow up on medication and come prepared for a pap smear.

## 2024-07-24 NOTE — Progress Notes (Signed)
 Complete physical exam  Patient: Dana Eaton   DOB: 01-17-1986   38 y.o. Female  MRN: [id number]  Subjective:    Chief Complaint  Patient presents with   Transitions Of Care    Pt would like to discuss maybe starting a new anxiety depression medication. Also mentioned strep throat finished antibiotics earlier in the wk but still feels sore a little bit    Dana Eaton is a 38 y.o. female who presents today for a complete physical exam. She reports consuming a  diet. Gym/ health club routine includes mod to heavy weightlifting, treadmill, and 2-4. She generally feels fairly well. She reports sleeping fairly well. She does have additional problems to discuss today.   Discussed the use of AI scribe software for clinical note transcription with the patient, who gave verbal consent to proceed.  History of Present Illness   Dana Eaton is a 38 year old female who presents for an annual well check and to discuss medication management for anxiety and stress.  She is contemplating resuming buspirone  or exploring other options such as seeing a psychologist. She was initially prescribed buspirone  for symptoms of tiredness, not feeling her best, and stress. She reports a history of mold exposure in her home and increased stress related to raising young children. She has stopped taking buspirone  and is considering other medication options, such as Wellbutrin , which her sister has used.  She experiences increased irritability and stress, particularly when dealing with her children, aged seven and four. She has frequent headaches, especially upon returning home from work, which she associates with stress and the demands of parenting.  She has experienced neck stiffness and headaches, initially suspecting a connection to strep throat. She initially tested negative for strep throat, but later tested positive at urgent care after her daughter was diagnosed with strep. An x-ray was performed to  rule out a fish bone in her throat, which was negative.  She has a history of a positive ANA test, followed by further testing that showed no significant findings. Her mother has arthritis. She experiences fatigue and neck pain but no joint pain in her hands or knees.  She has a history of Giardia infection in 2020, which she feels has impacted her energy levels and overall health, leading to increased fatigue and frequent illnesses.  Her sleep is often disrupted by her children, and she sometimes wakes up at 3 AM and struggles to return to sleep. She has tried melatonin and occasionally takes Benadryl  for sleep. She experiences postnasal drainage and morning headaches, which she attributes to dust and possibly mold in her home.  She follows a general diet without specific restrictions and tries to eat healthily when possible. She exercises two to four days a week, either at the gym or by walking around her workplace. She works in [employer], Animal nutritionist for hospice care.       Most recent fall risk assessment:    07/24/2024    3:15 PM  Fall Risk   Falls in the past year? 0  Number falls in past yr: 0  Injury with Fall? 0  Risk for fall due to : No Fall Risks  Follow up Falls evaluation completed     Most recent depression screenings:    07/24/2024    3:15 PM 05/20/2023    3:28 PM  PHQ 2/9 Scores  PHQ - 2 Score 3 2  PHQ- 9 Score 10     Vision:Yes, within last year  and  Dental: No current dental problems and Receives regular dental care  Patient Active Problem List   Diagnosis Date Noted   Eustachian tube dysfunction, bilateral 04/01/2024   Bilateral hearing loss 05/21/2023   GAD (generalized anxiety disorder) 05/21/2023   Cervical lymphadenopathy 05/21/2023   Stress 04/22/2023   Diarrhea 04/22/2023   Low weight 04/22/2023   Other fatigue 03/28/2023   Brain fog 03/28/2023   Viral syndrome 12/28/2022   Concentration deficit 09/28/2022   Diastasis recti 05/02/2018   Family  history of osteoporosis 08/17/2014   Past Medical History:  Diagnosis Date   Anemia    History of intestinal parasite    Past Surgical History:  Procedure Laterality Date   MANDIBLE SURGERY     Social History   Tobacco Use   Smoking status: Never   Smokeless tobacco: Never   Tobacco comments:    tried it once before-smoking   Vaping Use   Vaping status: Never Used  Substance Use Topics   Alcohol use: Not Currently    Alcohol/week: 0.0 standard drinks of alcohol   Drug use: No      Patient Care Team: Lowella Benton CROME, [place] as PCP - General (Physician Assistant) Charlanne Groom, MD as Consulting Physician (Gastroenterology)   Outpatient Medications Prior to Visit  Medication Sig   busPIRone  (BUSPAR ) 5 MG tablet Take 1 tablet (5 mg total) by mouth 2 (two) times daily as needed (anxiety). Needs apt for further refills   dicyclomine  (BENTYL ) 10 MG capsule Take 1 capsule (10 mg total) by mouth 4 (four) times daily -  before meals and at bedtime.   ondansetron  (ZOFRAN ) 4 MG tablet Take 1 tablet (4 mg total) by mouth every 8 (eight) hours as needed for nausea or vomiting.   ASHWAGANDHA 35 PO Take by mouth. (Patient not taking: Reported on 07/24/2024)   b complex vitamins capsule Take 1 capsule by mouth daily. (Patient not taking: Reported on 07/24/2024)   Multiple Vitamins-Minerals (IMMUNE SUPPORT PO) Take by mouth. (Patient not taking: Reported on 07/24/2024)   Omega-3 Fatty Acids (FISH OIL) 300 MG CAPS Take by mouth. (Patient not taking: Reported on 07/24/2024)   No facility-administered medications prior to visit.    ROS Complete 12 point ROS performed with all pertinent positives listed in HPI      Objective:     BP 116/73   Pulse 60   Resp 18   Ht 5' 4 (1.626 m)   Wt 114 lb 12 oz (52.1 kg)   SpO2 100%   BMI 19.70 kg/m  BP Readings from Last 3 Encounters:  07/24/24 116/73  04/01/24 105/69  08/12/23 123/64   Wt Readings from Last 3 Encounters:  07/24/24 114 lb 12  oz (52.1 kg)  04/01/24 113 lb (51.3 kg)  08/12/23 113 lb 9 oz (51.5 kg)      Physical Exam Vitals and nursing note reviewed.  Constitutional:      General: She is not in acute distress.    Appearance: Normal appearance. She is not ill-appearing, toxic-appearing or diaphoretic.  HENT:     Head: Normocephalic and atraumatic.     Right Ear: Tympanic membrane, ear canal and external ear normal. There is no impacted cerumen.     Left Ear: Tympanic membrane, ear canal and external ear normal. There is no impacted cerumen.     Nose: Nose normal.     Mouth/Throat:     Mouth: Mucous membranes are moist.     Pharynx: Oropharynx is clear.  No oropharyngeal exudate or posterior oropharyngeal erythema.  Eyes:     General: No scleral icterus.       Right eye: No discharge.        Left eye: No discharge.     Extraocular Movements: Extraocular movements intact.     Pupils: Pupils are equal, round, and reactive to light.  Neck:     Thyroid: No thyroid mass, thyromegaly or thyroid tenderness.  Cardiovascular:     Rate and Rhythm: Normal rate and regular rhythm.     Pulses: Normal pulses.     Heart sounds: No murmur heard. Pulmonary:     Effort: Pulmonary effort is normal. No respiratory distress.     Breath sounds: Normal breath sounds. No stridor. No wheezing or rhonchi.  Abdominal:     General: Abdomen is flat. Bowel sounds are normal. There is no distension.     Palpations: Abdomen is soft. There is no mass.     Tenderness: There is no abdominal tenderness. There is no guarding.  Musculoskeletal:     Cervical back: Normal range of motion and neck supple. No rigidity or tenderness.     Right lower leg: No edema.     Left lower leg: No edema.  Lymphadenopathy:     Cervical: Cervical adenopathy (bilateral submandibular lymphadenopathy) present.  Skin:    General: Skin is warm and dry.     Coloration: Skin is not jaundiced.     Findings: No bruising, erythema or rash.  Neurological:      General: No focal deficit present.     Mental Status: She is alert and oriented to person, place, and time.     Sensory: No sensory deficit.     Motor: No weakness.  Psychiatric:        Mood and Affect: Mood normal.        Behavior: Behavior normal.           Assessment & Plan:    Routine Health Maintenance and Physical Exam  Immunization History  Administered Date(s) Administered   Influenza,inj,Quad PF,6+ Mos 09/10/2016, 09/06/2017, 10/16/2018   Tdap 09/27/2014, 07/12/2016, 04/25/2020    Health Maintenance  Topic Date Due   Hepatitis C Screening  Never done   Hepatitis B Vaccines 19-59 Average Risk (1 of 3 - 19+ 3-dose series) Never done   HPV VACCINES (1 - 3-dose SCDM series) Never done   COVID-19 Vaccine (1 - 2024-25 season) Never done   Cervical Cancer Screening (HPV/Pap Cotest)  12/12/2023   INFLUENZA VACCINE  07/03/2024   DTaP/Tdap/Td (4 - Td or Tdap) 04/25/2030   HIV Screening  Completed   Pneumococcal Vaccine  Aged Out   Meningococcal B Vaccine  Aged Out    Discussed health benefits of physical activity, and encouraged her to engage in regular exercise appropriate for her age and condition.  Problem List Items Addressed This Visit     Other fatigue   Relevant Orders   Mononucleosis screen (Completed)   Brain fog   Relevant Medications   buPROPion  (WELLBUTRIN  XL) 150 MG 24 hr tablet   GAD (generalized anxiety disorder)   Relevant Medications   buPROPion  (WELLBUTRIN  XL) 150 MG 24 hr tablet   Cervical lymphadenopathy   Relevant Orders   Mononucleosis screen (Completed)   Other Visit Diagnoses       Routine health maintenance    -  Primary   Relevant Orders   CBC with Differential/Platelet (Completed)   Hemoglobin A1c (Completed)   TSH (  Completed)   Lipid panel (Completed)   Comprehensive metabolic panel with GFR (Completed)     ANA positive       Relevant Orders   ANA+ENA+DNA/DS+Scl 70+SjoSSA/B (Completed)      Return in about 4 weeks (around  08/21/2024).  Assessment and Plan    Fatigue and decreased energy Chronic fatigue possibly related to stress, sleep disturbances, and past Giardia infection. Discussed hormonal and environmental factors. - Recommend room air purifier for dust and mold. - Consider retesting ANA due to previous positive result.  Depression and anxiety symptoms Symptoms include irritability, headaches, and concentration difficulties. Discussed bupropion  benefits and risks, including weight neutrality and lower risk of sexual dysfunction. - Prescribe bupropion  once daily. - Schedule follow-up in 4-6 weeks to assess medication efficacy and side effects.  Headache Frequent headaches linked to stress and environmental factors. Potential link to anxiety and stress discussed.  Cervical lymphadenopathy Enlarged cervical lymph nodes with potential link to fatigue. Considered differential diagnosis including mono. - Order mono test.  Neck pain and stiffness Neck pain and stiffness possibly related to environmental factors vs tension headache from stress. - trial of wellbutrin  to help with stress - moist heat - repeat ANA testing today  Postnasal drainage and upper respiratory symptoms Chronic postnasal drainage possibly related to environmental allergens. - Recommend Flonase nasal spray for eustachian tube dysfunction. - Recommend regular dusting of fan and use of room air purifier.  Adult Wellness Visit Routine annual wellness visit. Discussed general health, diet, and exercise. Reports overall decline in health over past 2-3 years. - Perform routine annual blood work. - Schedule Pap smear. - Discuss mammogram screening to start at age 23 unless family history indicates earlier screening.          Benton LITTIE Gave, PA

## 2024-07-25 ENCOUNTER — Ambulatory Visit: Payer: Self-pay | Admitting: Urgent Care

## 2024-07-27 LAB — CBC WITH DIFFERENTIAL/PLATELET
Basophils Absolute: 0 x10E3/uL (ref 0.0–0.2)
Basos: 1 %
EOS (ABSOLUTE): 0.1 x10E3/uL (ref 0.0–0.4)
Eos: 2 %
Hematocrit: 40.4 % (ref 34.0–46.6)
Hemoglobin: 13 g/dL (ref 11.1–15.9)
Immature Grans (Abs): 0 x10E3/uL (ref 0.0–0.1)
Immature Granulocytes: 0 %
Lymphocytes Absolute: 2.5 x10E3/uL (ref 0.7–3.1)
Lymphs: 29 %
MCH: 31.3 pg (ref 26.6–33.0)
MCHC: 32.2 g/dL (ref 31.5–35.7)
MCV: 97 fL (ref 79–97)
Monocytes Absolute: 0.5 x10E3/uL (ref 0.1–0.9)
Monocytes: 6 %
Neutrophils Absolute: 5.4 x10E3/uL (ref 1.4–7.0)
Neutrophils: 62 %
Platelets: 279 x10E3/uL (ref 150–450)
RBC: 4.15 x10E6/uL (ref 3.77–5.28)
RDW: 11.5 % — ABNORMAL LOW (ref 11.7–15.4)
WBC: 8.6 x10E3/uL (ref 3.4–10.8)

## 2024-07-27 LAB — ANA+ENA+DNA/DS+SCL 70+SJOSSA/B
ENA RNP Ab: 0.2 AI (ref 0.0–0.9)
ENA SM Ab Ser-aCnc: <0.2 AI (ref 0.0–0.9)
ENA SSA (RO) Ab: <0.2 AI (ref 0.0–0.9)
ENA SSB (LA) Ab: <0.2 AI (ref 0.0–0.9)
Scleroderma (Scl-70) (ENA) Antibody, IgG: <0.2 AI (ref 0.0–0.9)
dsDNA Ab: 1 [IU]/mL (ref 0–9)

## 2024-07-27 LAB — COMPREHENSIVE METABOLIC PANEL WITH GFR
ALT: 25 IU/L (ref 0–32)
AST: 18 IU/L (ref 0–40)
Albumin: 4.8 g/dL (ref 3.9–4.9)
Alkaline Phosphatase: 50 IU/L (ref 44–121)
BUN/Creatinine Ratio: 16 (ref 9–23)
BUN: 12 mg/dL (ref 6–20)
Bilirubin Total: 0.3 mg/dL (ref 0.0–1.2)
CO2: 26 mmol/L (ref 20–29)
Calcium: 9.2 mg/dL (ref 8.7–10.2)
Chloride: 101 mmol/L (ref 96–106)
Creatinine, Ser: 0.77 mg/dL (ref 0.57–1.00)
Globulin, Total: 2.5 g/dL (ref 1.5–4.5)
Glucose: 66 mg/dL — ABNORMAL LOW (ref 70–99)
Potassium: 3.6 mmol/L (ref 3.5–5.2)
Sodium: 142 mmol/L (ref 134–144)
Total Protein: 7.3 g/dL (ref 6.0–8.5)
eGFR: 102 mL/min/1.73 (ref >59)

## 2024-07-27 LAB — HEMOGLOBIN A1C
Est. average glucose Bld gHb Est-mCnc: 105 mg/dL
Hgb A1c MFr Bld: 5.3 % (ref 4.8–5.6)

## 2024-07-27 LAB — LIPID PANEL
Chol/HDL Ratio: 1.9 ratio (ref 0.0–4.4)
Cholesterol, Total: 147 mg/dL (ref 100–199)
HDL: 77 mg/dL (ref >39)
LDL Chol Calc (NIH): 61 mg/dL (ref 0–99)
Triglycerides: 33 mg/dL (ref 0–149)
VLDL Cholesterol Cal: 9 mg/dL (ref 5–40)

## 2024-07-27 LAB — TSH: TSH: 0.842 u[IU]/mL (ref 0.450–4.500)

## 2024-07-27 LAB — MONONUCLEOSIS SCREEN: Mono Screen: NEGATIVE

## 2024-08-04 ENCOUNTER — Encounter: Payer: Self-pay | Admitting: Sports Medicine

## 2024-08-28 ENCOUNTER — Ambulatory Visit: Admitting: Urgent Care

## 2024-08-28 ENCOUNTER — Other Ambulatory Visit (HOSPITAL_COMMUNITY)
Admission: RE | Admit: 2024-08-28 | Discharge: 2024-08-28 | Disposition: A | Source: Ambulatory Visit | Attending: Urgent Care | Admitting: Urgent Care

## 2024-08-28 ENCOUNTER — Encounter: Payer: Self-pay | Admitting: Urgent Care

## 2024-08-28 VITALS — BP 105/66 | HR 80 | Ht 64.0 in | Wt 116.0 lb

## 2024-08-28 DIAGNOSIS — Z124 Encounter for screening for malignant neoplasm of cervix: Secondary | ICD-10-CM | POA: Diagnosis present

## 2024-08-28 DIAGNOSIS — G44209 Tension-type headache, unspecified, not intractable: Secondary | ICD-10-CM

## 2024-08-28 DIAGNOSIS — F411 Generalized anxiety disorder: Secondary | ICD-10-CM

## 2024-08-28 MED ORDER — BUTALBITAL-APAP-CAFFEINE 50-325-40 MG PO TABS: 1.0000 | ORAL_TABLET | Freq: Four times a day (QID) | ORAL | 2 refills | Status: AC | PRN

## 2024-08-28 NOTE — Patient Instructions (Signed)
 Pap smear completed.  Please continue wellbutrin  150mg  daily. Use Fioricet as needed for headaches, not to exceed 4 tabs weekly.  If no significant improvement in headaches with above treatment, please return for follow up.

## 2024-08-28 NOTE — Progress Notes (Signed)
 Established Patient Office Visit  Subjective:  Patient ID: Dana Eaton, female    DOB: September 13, 1986  Age: 38 y.o. MRN: [id number]  Chief Complaint  Patient presents with   Follow-up    anxiety   Gynecologic Exam    Gynecologic Exam    Pt presents for pap smear. No GU c/o.  Pt also states wellbutrin  has helped significantly. Notes excellent improvement in depressive sx in morning and decreased HA, however still has sx in afternoon. Gets HA several times weekly on drive back from work. Feels tension and pain behind eyes.  Patient Active Problem List   Diagnosis Date Noted   Eustachian tube dysfunction, bilateral 04/01/2024   Bilateral hearing loss 05/21/2023   GAD (generalized anxiety disorder) 05/21/2023   Cervical lymphadenopathy 05/21/2023   Stress 04/22/2023   Diarrhea 04/22/2023   Low weight 04/22/2023   Other fatigue 03/28/2023   Brain fog 03/28/2023   Viral syndrome 12/28/2022   Concentration deficit 09/28/2022   Diastasis recti 05/02/2018   Family history of osteoporosis 08/17/2014   Past Medical History:  Diagnosis Date   Anemia    History of intestinal parasite    Past Surgical History:  Procedure Laterality Date   MANDIBLE SURGERY     Social History   Tobacco Use   Smoking status: Never   Smokeless tobacco: Never   Tobacco comments:    tried it once before-smoking   Vaping Use   Vaping status: Never Used  Substance Use Topics   Alcohol use: Not Currently    Alcohol/week: 0.0 standard drinks of alcohol   Drug use: No      ROS: as noted in HPI  Objective:     BP 105/66   Pulse 80   Ht 5' 4 (1.626 m)   Wt 116 lb (52.6 kg)   BMI 19.91 kg/m  BP Readings from Last 3 Encounters:  08/28/24 105/66  07/24/24 116/73  04/01/24 105/69   Wt Readings from Last 3 Encounters:  08/28/24 116 lb (52.6 kg)  07/24/24 114 lb 12 oz (52.1 kg)  04/01/24 113 lb (51.3 kg)      Physical Exam Vitals and nursing note reviewed. Exam conducted with a  chaperone present.  Constitutional:      General: She is not in acute distress.    Appearance: Normal appearance. She is not toxic-appearing.  HENT:     Head: Normocephalic.     Mouth/Throat:     Mouth: Mucous membranes are moist.  Cardiovascular:     Rate and Rhythm: Normal rate.  Pulmonary:     Effort: Pulmonary effort is normal. No respiratory distress.  Genitourinary:    Pubic Area: No rash or pubic lice.      Labia:        Right: No rash, tenderness, lesion or injury.        Left: No rash, tenderness, lesion or injury.      Urethra: No prolapse, urethral pain, urethral swelling or urethral lesion.     Vagina: Normal. No vaginal discharge or lesions.     Cervix: Normal. No cervical motion tenderness, discharge, friability or lesion.     Uterus: Normal.      Adnexa: Right adnexa normal and left adnexa normal.       Right: No mass, tenderness or fullness.         Left: No mass, tenderness or fullness.       Rectum: Normal.  Lymphadenopathy:     Cervical: No cervical adenopathy.  Lower Body: No right inguinal adenopathy. No left inguinal adenopathy.  Skin:    General: Skin is warm.     Coloration: Skin is not jaundiced.     Findings: No bruising or rash.  Neurological:     General: No focal deficit present.     Mental Status: She is alert and oriented to person, place, and time.        08/28/2024    2:11 PM 07/24/2024    3:15 PM 05/20/2023    3:28 PM 04/22/2023    9:49 AM 03/31/2020    1:59 PM  Depression screen PHQ 2/9  Decreased Interest 1 2 1 1 1   Down, Depressed, Hopeless 1 1 1 2  0  PHQ - 2 Score 2 3 2 3 1   Altered sleeping 1 1   0  Tired, decreased energy 1 2   1   Change in appetite 1 0   0  Feeling bad or failure about yourself  1 1   0  Trouble concentrating 2 2   0  Moving slowly or fidgety/restless 1 1   1   Suicidal thoughts 0 0   0  PHQ-9 Score 9 10   3   Difficult doing work/chores  Very difficult   Somewhat difficult     No results found for any  visits on 08/28/24.  Last CBC Lab Results  Component Value Date   WBC 8.6 07/24/2024   HGB 13.0 07/24/2024   HCT 40.4 07/24/2024   MCV 97 07/24/2024   MCH 31.3 07/24/2024   RDW 11.5 (L) 07/24/2024   PLT 279 07/24/2024   Last metabolic panel Lab Results  Component Value Date   GLUCOSE 66 (L) 07/24/2024   NA 142 07/24/2024   K 3.6 07/24/2024   CL 101 07/24/2024   CO2 26 07/24/2024   BUN 12 07/24/2024   CREATININE 0.77 07/24/2024   EGFR 102 07/24/2024   CALCIUM 9.2 07/24/2024   PROT 7.3 07/24/2024   ALBUMIN 4.8 07/24/2024   LABGLOB 2.5 07/24/2024   BILITOT 0.3 07/24/2024   ALKPHOS 50 07/24/2024   AST 18 07/24/2024   ALT 25 07/24/2024   Last lipids Lab Results  Component Value Date   CHOL 147 07/24/2024   HDL 77 07/24/2024   LDLCALC 61 07/24/2024   TRIG 33 07/24/2024   CHOLHDL 1.9 07/24/2024   Last hemoglobin A1c Lab Results  Component Value Date   HGBA1C 5.3 07/24/2024      The ASCVD Risk score (Arnett DK, et al., 2019) failed to calculate for the following reasons:   The 2019 ASCVD risk score is only valid for ages 84 to 65  Assessment & Plan:  Cervical cancer screening -     Cytology - PAP  Tension headache -     Butalbital -APAP-Caffeine ; Take 1 tablet by mouth every 6 (six) hours as needed for headache. Not to exceed 4 tabs per week  Dispense: 30 tablet; Refill: 2  GAD (generalized anxiety disorder)  Pap smear completed today. Continue wellbutrin  150mg  daily Add butalbital  as needed for headaches.   No follow-ups on file.   Benton LITTIE Gave, PA

## 2024-08-31 ENCOUNTER — Ambulatory Visit: Payer: Self-pay | Admitting: Urgent Care

## 2024-08-31 LAB — CYTOLOGY - PAP
Comment: NEGATIVE
Diagnosis: NEGATIVE
High risk HPV: NEGATIVE

## 2025-01-07 ENCOUNTER — Encounter: Payer: Self-pay | Admitting: Urgent Care
